# Patient Record
Sex: Female | Born: 2000 | Race: White | Hispanic: No | Marital: Single | Smoking: Never smoker
Health system: Southern US, Community
[De-identification: ages and names within clinical notes are randomized; demographics above are authoritative.]

## PROBLEM LIST (undated history)

## (undated) DIAGNOSIS — O1490 Unspecified pre-eclampsia, unspecified trimester: Secondary | ICD-10-CM

## (undated) DIAGNOSIS — F32A Depression, unspecified: Secondary | ICD-10-CM

## (undated) HISTORY — PX: NO PAST SURGERIES: SHX2092

---
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
## 2021-02-25 ENCOUNTER — Encounter (HOSPITAL_COMMUNITY): Payer: Self-pay

## 2021-02-25 ENCOUNTER — Emergency Department (HOSPITAL_COMMUNITY)
Admission: EM | Admit: 2021-02-25 | Discharge: 2021-02-26 | Disposition: A | Discharge status: Home / Self Care | Payer: BC Managed Care – PPO | Attending: Emergency Medicine | Admitting: Emergency Medicine

## 2021-02-25 ENCOUNTER — Other Ambulatory Visit: Payer: Self-pay

## 2021-02-25 DIAGNOSIS — T50904A Poisoning by unspecified drugs, medicaments and biological substances, undetermined, initial encounter: Secondary | ICD-10-CM | POA: Diagnosis not present

## 2021-02-25 DIAGNOSIS — T402X4A Poisoning by other opioids, undetermined, initial encounter: Secondary | ICD-10-CM | POA: Diagnosis not present

## 2021-02-25 DIAGNOSIS — R519 Headache, unspecified: Secondary | ICD-10-CM | POA: Diagnosis not present

## 2021-02-25 DIAGNOSIS — F32A Depression, unspecified: Secondary | ICD-10-CM | POA: Insufficient documentation

## 2021-02-25 DIAGNOSIS — Z20822 Contact with and (suspected) exposure to covid-19: Secondary | ICD-10-CM | POA: Insufficient documentation

## 2021-02-25 DIAGNOSIS — F419 Anxiety disorder, unspecified: Secondary | ICD-10-CM | POA: Diagnosis not present

## 2021-02-25 DIAGNOSIS — T391X4A Poisoning by 4-Aminophenol derivatives, undetermined, initial encounter: Secondary | ICD-10-CM | POA: Insufficient documentation

## 2021-02-25 LAB — CBC WITH DIFFERENTIAL/PLATELET
Abs Immature Granulocytes: 0.04 10*3/uL (ref 0.00–0.07)
Basophils Absolute: 0 10*3/uL (ref 0.0–0.1)
Basophils Relative: 0 %
Eosinophils Absolute: 0 10*3/uL (ref 0.0–0.5)
Eosinophils Relative: 0 %
HCT: 39.8 % (ref 36.0–46.0)
Hemoglobin: 13.2 g/dL (ref 12.0–15.0)
Immature Granulocytes: 0 %
Lymphocytes Relative: 21 %
Lymphs Abs: 1.9 10*3/uL (ref 0.7–4.0)
MCH: 28.9 pg (ref 26.0–34.0)
MCHC: 33.2 g/dL (ref 30.0–36.0)
MCV: 87.3 fL (ref 80.0–100.0)
Monocytes Absolute: 0.5 10*3/uL (ref 0.1–1.0)
Monocytes Relative: 6 %
Neutro Abs: 6.5 10*3/uL (ref 1.7–7.7)
Neutrophils Relative %: 73 %
Platelets: 308 10*3/uL (ref 150–400)
RBC: 4.56 MIL/uL (ref 3.87–5.11)
RDW: 12.7 % (ref 11.5–15.5)
WBC: 9 10*3/uL (ref 4.0–10.5)
nRBC: 0 % (ref 0.0–0.2)

## 2021-02-25 LAB — COMPREHENSIVE METABOLIC PANEL
ALT: 15 U/L (ref 0–44)
AST: 18 U/L (ref 15–41)
Albumin: 4.3 g/dL (ref 3.5–5.0)
Alkaline Phosphatase: 58 U/L (ref 38–126)
Anion gap: 12 (ref 5–15)
BUN: 10 mg/dL (ref 6–20)
CO2: 19 mmol/L — ABNORMAL LOW (ref 22–32)
Calcium: 9.7 mg/dL (ref 8.9–10.3)
Chloride: 109 mmol/L (ref 98–111)
Creatinine, Ser: 0.76 mg/dL (ref 0.44–1.00)
GFR, Estimated: >60 mL/min (ref >60)
Glucose, Bld: 99 mg/dL (ref 70–99)
Potassium: 3.7 mmol/L (ref 3.5–5.1)
Sodium: 140 mmol/L (ref 135–145)
Total Bilirubin: 0.7 mg/dL (ref 0.3–1.2)
Total Protein: 7.6 g/dL (ref 6.5–8.1)

## 2021-02-25 LAB — I-STAT BETA HCG BLOOD, ED (MC, WL, AP ONLY): I-stat hCG, quantitative: <5 m[IU]/mL (ref <5)

## 2021-02-25 MED ORDER — SODIUM CHLORIDE 0.9 % IV BOLUS
1000.0000 mL | Freq: Once | INTRAVENOUS | Status: AC
  Administered 2021-02-26: 1000 mL via INTRAVENOUS

## 2021-02-25 NOTE — ED Provider Notes (Signed)
[facility]-EMERGENCY DEPT Provider Note   CSN: [id number] Arrival date & time: 02/25/21  2219     History Chief Complaint  Patient presents with  . Ingestion    Christine Brewer is a 20 y.o. female.  20 year old female presents to the emergency department for evaluation following intentional overdose.  She reports ingesting 5 to 6 tablets of Tylenol with codeine (300/30mg ).  Ingestion was at 2030 tonight.  States that she took this medication because she wanted "things to get quiet". She does endorse suicidal ideations with intent tonight, but quickly regretted her ingestion and attempted to self induce vomiting PTA. She c/o headache currently. No abdominal pain, vomiting. Denies other co-ingestion as well as prior hx of suicide attempt. Has been off her Zoloft, which she takes for anxiety, due to issues sleeping on the medication.       History reviewed. No pertinent past medical history.  There are no problems to display for this patient.   History reviewed. No pertinent surgical history.   OB History   No obstetric history on file.     No family history on file.  Social History   Tobacco Use  . Smoking status: Never Smoker  . Smokeless tobacco: Never Used  Substance Use Topics  . Alcohol use: Never  . Drug use: Never    Home Medications Prior to Admission medications   Medication Sig Start Date End Date Taking? Authorizing Provider  cetirizine (ZYRTEC) 10 MG tablet Take 10 mg by mouth daily.   Yes [provider]    Allergies    Azithromycin, Penicillins, and Cefdinir  Review of Systems   Review of Systems  Ten systems reviewed and are negative for acute change, except as noted in the HPI.    Physical Exam Updated Vital Signs BP 104/61   Pulse 84   Temp 99 F (37.2 C) (Oral)   Resp 16   Ht 5\' 4"  (1.626 m)   Wt 69.4 kg   SpO2 100%   BMI 26.26 kg/m   Physical Exam Vitals and nursing note reviewed.   Constitutional:      General: She is not in acute distress.    Appearance: She is well-developed and well-nourished. She is not diaphoretic.     Comments: Nontoxic appearing and in NAD  HENT:     Head: Normocephalic and atraumatic.  Eyes:     General: No scleral icterus.    Extraocular Movements: EOM normal.     Conjunctiva/sclera: Conjunctivae normal.     Comments: 55mm, equal reactive pupils.  Cardiovascular:     Rate and Rhythm: Regular rhythm. Tachycardia present.     Pulses: Normal pulses.  Pulmonary:     Effort: Pulmonary effort is normal. No respiratory distress.     Comments: Respirations even and unlabored. Lungs CTAB. Musculoskeletal:        General: Normal range of motion.     Cervical back: Normal range of motion.  Skin:    General: Skin is warm and dry.     Coloration: Skin is not pale.     Findings: No erythema or rash.  Neurological:     Mental Status: She is alert and oriented to person, place, and time.     Coordination: Coordination normal.  Psychiatric:        Mood and Affect: Mood is anxious and depressed. Affect is tearful.        Speech: Speech normal.        Behavior: Behavior  is withdrawn.        Thought Content: Thought content includes suicidal ideation. Thought content does not include homicidal ideation.     ED Results / Procedures / Treatments   Labs (all labs ordered are listed, but only abnormal results are displayed) Labs Reviewed  SALICYLATE LEVEL - Abnormal; Notable for the following components:      Result Value   Salicylate Lvl <7.0 (*)    All other components within normal limits  COMPREHENSIVE METABOLIC PANEL - Abnormal; Notable for the following components:   CO2 19 (*)    All other components within normal limits  RAPID URINE DRUG SCREEN, HOSP PERFORMED - Abnormal; Notable for the following components:   Opiates POSITIVE (*)    Tetrahydrocannabinol POSITIVE (*)    All other components within normal limits  ACETAMINOPHEN LEVEL  - Abnormal; Notable for the following components:   Acetaminophen (Tylenol), Serum <10 (*)    All other components within normal limits  RESP PANEL BY RT-PCR (FLU A&B, COVID) ARPGX2  ACETAMINOPHEN LEVEL  CBC WITH DIFFERENTIAL/PLATELET  ETHANOL  I-STAT BETA HCG BLOOD, ED ([facility], [facility], [facility] ONLY)    EKG EKG Interpretation  Date/Time:  Monday February 25 2021 23:19:03 EST Ventricular Rate:  93 PR Interval:    QRS Duration: 79 QT Interval:  338 QTC Calculation: 421 R Axis:   76 Text Interpretation: Sinus rhythm Confirmed by Kristine Royal [phone]) on 02/25/2021 11:30:11 PM   Radiology No results found.  Procedures Procedures   Medications Ordered in ED Medications  sodium chloride 0.9 % bolus 1,000 mL (1,000 mLs Intravenous New Bag/Given 02/26/21 0000)    ED Course  I have reviewed the triage vital signs and the nursing notes.  Pertinent labs & imaging results that were available during my care of the patient were reviewed by me and considered in my medical decision making (see chart for details).  Clinical Course as of 02/26/21 1751  Mon Feb 25, 2021  2255 Poison Control recommendations are for EKG, 4 hour tylenol level which would be at 0230, and 6 hr observation. [KH]  Tue Feb 26, 2021  0409 Repeat acetaminophen <10. Patient cleared after 6 hour observation.  [KH]    Clinical Course User Index [KH] Antony Madura, PA-C   MDM Rules/Calculators/A&P                          Patient presenting after overdose; vague SI, but not a clear motivator for ingestion today. Observed in the ED. Labs reassuring. Medically cleared and pending TTS assessment, recommendations. Disposition to be determined by oncoming ED provider.   Final Clinical Impression(s) / ED Diagnoses Final diagnoses:  Drug overdose, undetermined intent, initial encounter    Rx / DC Orders ED Discharge Orders    None       Antony Madura, PA-C 02/26/21 0615    Wynetta Fines, MD 02/26/21 1455

## 2021-02-25 NOTE — ED Triage Notes (Addendum)
Pt sts ingesting 5-6 Acetaminophen/ Codeine 300-30mg  at 2030 tonight. Pt denies suicidal ideation or self harm. Sts she was just trying to get some "good sleep".

## 2021-02-25 NOTE — ED Notes (Signed)
Poison control suggests...  EKG 4 hour tylenol level which would be 0230am 6 hr observation

## 2021-02-26 ENCOUNTER — Encounter (HOSPITAL_COMMUNITY): Payer: Self-pay | Admitting: Registered Nurse

## 2021-02-26 DIAGNOSIS — T50901A Poisoning by unspecified drugs, medicaments and biological substances, accidental (unintentional), initial encounter: Secondary | ICD-10-CM | POA: Insufficient documentation

## 2021-02-26 DIAGNOSIS — T50904A Poisoning by unspecified drugs, medicaments and biological substances, undetermined, initial encounter: Secondary | ICD-10-CM

## 2021-02-26 LAB — ACETAMINOPHEN LEVEL
Acetaminophen (Tylenol), Serum: 13 ug/mL (ref 10–30)
Acetaminophen (Tylenol), Serum: <10 ug/mL — ABNORMAL LOW (ref 10–30)

## 2021-02-26 LAB — RESP PANEL BY RT-PCR (FLU A&B, COVID) ARPGX2
Influenza A by PCR: NEGATIVE
Influenza B by PCR: NEGATIVE
SARS Coronavirus 2 by RT PCR: NEGATIVE

## 2021-02-26 LAB — SALICYLATE LEVEL: Salicylate Lvl: <7 mg/dL — ABNORMAL LOW (ref 7.0–30.0)

## 2021-02-26 LAB — RAPID URINE DRUG SCREEN, HOSP PERFORMED
Amphetamines: NOT DETECTED
Barbiturates: NOT DETECTED
Benzodiazepines: NOT DETECTED
Cocaine: NOT DETECTED
Opiates: POSITIVE — AB
Tetrahydrocannabinol: POSITIVE — AB

## 2021-02-26 LAB — ETHANOL: Alcohol, Ethyl (B): <10 mg/dL (ref <10)

## 2021-02-26 MED ORDER — FLUOXETINE HCL 10 MG PO CAPS: 10.0000 mg | ORAL_CAPSULE | Freq: Every day | ORAL | 1 refills | Status: AC

## 2021-02-26 MED ORDER — FLUOXETINE HCL 10 MG PO CAPS
10.0000 mg | ORAL_CAPSULE | Freq: Every day | ORAL | Status: DC
  Administered 2021-02-26: 10 mg via ORAL
  Filled 2021-02-26: qty 1

## 2021-02-26 NOTE — ED Notes (Signed)
Pt mother is at bedside. Home medication has been returned to her.

## 2021-02-26 NOTE — [facility] Assessment (Signed)
[facility] Assessment Progress Note  Per Shuvon Rankin, NP, this voluntary pt does not require psychiatric hospitalization at this time.  Pt is psychiatrically cleared.  Shuvon reports that pt will need outpatient psychiatry and therapy.  This Clinical research associate spoke to pt, discussing Partial Hospitalization Program with her.  She is not interested in this.  She already sees an outpatient therapist, but agrees to have in intake appointment for psychiatry scheduled for her.  At 10:48 I called the [facility] [place] and spoke to Aragon.  She has scheduled pt for an intake appointment with Thresa Ross, MD on Wednesday 04/03/2021 at 11:00.  This has been included in pt's discharge instrucitions.  EDP Derwood Kaplan, MD and pt's nurse, Cammy Copa, have been notified.  Doylene Canning, MA Triage Specialist [phone]

## 2021-02-26 NOTE — ED Notes (Signed)
Gina (poison control) will close out poison control case for pt.  RN provided lab value of most recent acetaminophen, ECG results and VS.

## 2021-02-26 NOTE — Consult Note (Signed)
[facility] Face-to-Face Psychiatry Consult   Reason for Consult:  Overdose Referring Physician:  [facility] EDP Patient Identification: Christine Brewer MRN:  [id number] Principal Diagnosis: <principal problem not specified> Diagnosis:  Active Problems:   * No active hospital problems. *   Total Time spent with patient: 30 minutes  Subjective:   Christine Brewer is a 20 y.o. female patient admitted to [facility] ED after presenting with complaints of overdosing on medication.  HPI:  Christine Brewer, 20 y.o., female patient seen face to face  by this provider, consulted with Dr. Lucianne Muss; and chart reviewed on 02/26/21.  On evaluation Christine Brewer reports she was trying to sleep and took to much of her medications.  Patient states at no time was she suicidal.  Patient lives with her mother and currently has outpatient psychiatric services.  Has tried medications in past but none has worked.  Denies prior suicide attempt.  Mother is at patient bedside and feels that patient is safe to come home; feels that it was an accident but dose have stressors and would benefit from medications to help with anxiety and depression.   During evaluation Christine Brewer is sitting up in bed in no acute distress.  She is alert, oriented x 4, calm and cooperative.  Her mood is depressed, anxious with congruent affect.  She does not appear to be responding to internal/external stimuli or delusional thoughts.  Patient denies suicidal/self-harm/homicidal ideation, psychosis, and paranoia.  Patient answered question appropriately.  Safety Plan: Patient lives with parents will go to mother, call 911, mobile crisis if start to feel suicidal Patient will follow up with outpatient psychiatric services The suicide prevention education provided includes the following:  Suicide risk factors  Suicide prevention and interventions  National Suicide Hotline telephone number  [facility] assessment telephone  number  [facility] Emergency Assistance 911  [facility] and/or Residential Mobile Crisis Unit telephone number   Request made of family/significant other to:  Remove weapons (e.g., guns, rifles, knives), all items previously/currently identified as safety concern.   Remove drugs/medications (over the counter, prescriptions, illicit drugs), all items previously/currently identified as a safety concern.   Past Psychiatric History: Anxiety  Risk to Self:  No Risk to Others:  No Prior Inpatient Therapy:  No Prior Outpatient Therapy:  Yes  Past Medical History: History reviewed. No pertinent past medical history. History reviewed. No pertinent surgical history. Family History: History reviewed. No pertinent family history. Family Psychiatric  History: Anxiety Social History:  Social History   Substance and Sexual Activity  Alcohol Use Never     Social History   Substance and Sexual Activity  Drug Use Never    Social History   Socioeconomic History  . Marital status: Single    Spouse name: Not on file  . Number of children: Not on file  . Years of education: Not on file  . Highest education level: Not on file  Occupational History  . Not on file  Tobacco Use  . Smoking status: Never Smoker  . Smokeless tobacco: Never Used  Substance and Sexual Activity  . Alcohol use: Never  . Drug use: Never  . Sexual activity: Not on file  Other Topics Concern  . Not on file  Social History Narrative  . Not on file   Social Determinants of Health   Financial Resource Strain: Not on file  Food Insecurity: Not on file  Transportation Needs: Not on file  Physical Activity: Not on file  Stress: Not on file  Social Connections: Not on file   Additional Social History:    Allergies:   Allergies  Allergen Reactions  . Azithromycin Nausea Only, Rash and Other (See Comments)  . Penicillins Nausea Only, Rash and Other (See Comments)  . Cefdinir Other (See Comments)     Labs:  Results for orders placed or performed during the hospital encounter of 02/25/21 (from the past 48 hour(s))  Rapid urine drug screen (hospital performed)     Status: Abnormal   Collection Time: 02/25/21 10:57 PM  Result Value Ref Range   Opiates POSITIVE (A) NONE DETECTED   Cocaine NONE DETECTED NONE DETECTED   Benzodiazepines NONE DETECTED NONE DETECTED   Amphetamines NONE DETECTED NONE DETECTED   Tetrahydrocannabinol POSITIVE (A) NONE DETECTED   Barbiturates NONE DETECTED NONE DETECTED    Comment: (NOTE) DRUG SCREEN FOR MEDICAL PURPOSES ONLY.  IF CONFIRMATION IS NEEDED FOR ANY PURPOSE, NOTIFY LAB WITHIN 5 DAYS.  LOWEST DETECTABLE LIMITS FOR URINE DRUG SCREEN Drug Class                     Cutoff (ng/mL) Amphetamine and metabolites    1000 Barbiturate and metabolites    200 Benzodiazepine                 200 Tricyclics and metabolites     300 Opiates and metabolites        300 Cocaine and metabolites        300 THC                            50 Performed at [facility], [address] [place], [place] [address]   Acetaminophen level     Status: None   Collection Time: 02/25/21 11:23 PM  Result Value Ref Range   Acetaminophen (Tylenol), Serum 13 10 - 30 ug/mL    Comment: (NOTE) Therapeutic concentrations vary significantly. A range of 10-30 ug/mL  may be an effective concentration for many patients. However, some  are best treated at concentrations outside of this range. Acetaminophen concentrations >150 ug/mL at 4 hours after ingestion  and >50 ug/mL at 12 hours after ingestion are often associated with  toxic reactions.  Performed at [facility], [address] [place], [place] [address]   Salicylate level     Status: Abnormal   Collection Time: 02/25/21 11:23 PM  Result Value Ref Range   Salicylate Lvl <7.0 (L) 7.0 - 30.0 mg/dL    Comment: Performed at [facility], [address]  [place], [place] [address]  CBC with Differential     Status: None   Collection Time: 02/25/21 11:23 PM  Result Value Ref Range   WBC 9.0 4.0 - 10.5 K/uL   RBC 4.56 3.87 - 5.11 MIL/uL   Hemoglobin 13.2 12.0 - 15.0 g/dL   HCT 91.439.8 78.236.0 - 95.646.0 %   MCV 87.3 80.0 - 100.0 fL   MCH 28.9 26.0 - 34.0 pg   MCHC 33.2 30.0 - 36.0 g/dL   RDW 21.312.7 08.611.5 - 57.815.5 %   Platelets 308 150 - 400 K/uL   nRBC 0.0 0.0 - 0.2 %   Neutrophils Relative % 73 %   Neutro Abs 6.5 1.7 - 7.7 K/uL   Lymphocytes Relative 21 %   Lymphs Abs 1.9 0.7 - 4.0 K/uL   Monocytes Relative 6 %   Monocytes Absolute 0.5 0.1 - 1.0 K/uL  Eosinophils Relative 0 %   Eosinophils Absolute 0.0 0.0 - 0.5 K/uL   Basophils Relative 0 %   Basophils Absolute 0.0 0.0 - 0.1 K/uL   Immature Granulocytes 0 %   Abs Immature Granulocytes 0.04 0.00 - 0.07 K/uL    Comment: Performed at [facility], [address], [place], [place] [address]  Comprehensive metabolic panel     Status: Abnormal   Collection Time: 02/25/21 11:23 PM  Result Value Ref Range   Sodium 140 135 - 145 mmol/L   Potassium 3.7 3.5 - 5.1 mmol/L   Chloride 109 98 - 111 mmol/L   CO2 19 (L) 22 - 32 mmol/L   Glucose, Bld 99 70 - 99 mg/dL    Comment: Glucose reference range applies only to samples taken after fasting for at least 8 hours.   BUN 10 6 - 20 mg/dL   Creatinine, Ser 4.78 0.44 - 1.00 mg/dL   Calcium 9.7 8.9 - 29.5 mg/dL   Total Protein 7.6 6.5 - 8.1 g/dL   Albumin 4.3 3.5 - 5.0 g/dL   AST 18 15 - 41 U/L   ALT 15 0 - 44 U/L   Alkaline Phosphatase 58 38 - 126 U/L   Total Bilirubin 0.7 0.3 - 1.2 mg/dL   GFR, Estimated >62 >13 mL/min    Comment: (NOTE) Calculated using the CKD-EPI Creatinine Equation (2021)    Anion gap 12 5 - 15    Comment: Performed at [facility], [address] [place], [place] [address]  Ethanol     Status: None   Collection Time: 02/25/21 11:23 PM  Result Value Ref Range   Alcohol, Ethyl (B) <10 <10  mg/dL    Comment: (NOTE) Lowest detectable limit for serum alcohol is 10 mg/dL.  For medical purposes only. Performed at [facility], [address], [place], [place] [address]   I-Stat Beta hCG blood, ED ([facility], [facility], [facility] only)     Status: None   Collection Time: 02/25/21 11:32 PM  Result Value Ref Range   I-stat hCG, quantitative <5.0 <5 mIU/mL   Comment 3            Comment:   GEST. AGE      CONC.  (mIU/mL)   <=1 WEEK        5 - 50     2 WEEKS       50 - 500     3 WEEKS       100 - 10,000     4 WEEKS     1,000 - 30,000        FEMALE AND NON-PREGNANT FEMALE:     LESS THAN 5 mIU/mL   Resp Panel by RT-PCR (Flu A&B, Covid) Nasopharyngeal Swab     Status: None   Collection Time: 02/26/21  1:27 AM   Specimen: Nasopharyngeal Swab; Nasopharyngeal(NP) swabs in vial transport medium  Result Value Ref Range   SARS Coronavirus 2 by RT PCR NEGATIVE NEGATIVE    Comment: (NOTE) SARS-CoV-2 target nucleic acids are NOT DETECTED.  The SARS-CoV-2 RNA is generally detectable in upper respiratory specimens during the acute phase of infection. The lowest concentration of SARS-CoV-2 viral copies this assay can detect is 138 copies/mL. A negative result does not preclude SARS-Cov-2 infection and should not be used as the sole basis for treatment or other patient management decisions. A negative result may occur with  improper specimen collection/handling, submission of specimen other than  nasopharyngeal swab, presence of viral mutation(s) within the areas targeted by this assay, and inadequate number of viral copies(<138 copies/mL). A negative result must be combined with clinical observations, patient history, and epidemiological information. The expected result is Negative.  Fact Sheet for Patients:  BloggerCourse.com  Fact Sheet for Healthcare Providers:  SeriousBroker.it  This test is no t yet approved or cleared by the  [place] FDA and  has been authorized for detection and/or diagnosis of SARS-CoV-2 by FDA under an Emergency Use Authorization (EUA). This EUA will remain  in effect (meaning this test can be used) for the duration of the COVID-19 declaration under Section 564(b)(1) of the Act, 21 U.S.C.section 360bbb-3(b)(1), unless the authorization is terminated  or revoked sooner.       Influenza A by PCR NEGATIVE NEGATIVE   Influenza B by PCR NEGATIVE NEGATIVE    Comment: (NOTE) The Xpert Xpress SARS-CoV-2/FLU/RSV plus assay is intended as an aid in the diagnosis of influenza from Nasopharyngeal swab specimens and should not be used as a sole basis for treatment. Nasal washings and aspirates are unacceptable for Xpert Xpress SARS-CoV-2/FLU/RSV testing.  Fact Sheet for Patients: BloggerCourse.com  Fact Sheet for Healthcare Providers: SeriousBroker.it  This test is not yet approved or cleared by the [place] FDA and has been authorized for detection and/or diagnosis of SARS-CoV-2 by FDA under an Emergency Use Authorization (EUA). This EUA will remain in effect (meaning this test can be used) for the duration of the COVID-19 declaration under Section 564(b)(1) of the Act, 21 U.S.C. section 360bbb-3(b)(1), unless the authorization is terminated or revoked.  Performed at [facility], [address], [place], [place] [address]   Acetaminophen level     Status: Abnormal   Collection Time: 02/26/21  3:11 AM  Result Value Ref Range   Acetaminophen (Tylenol), Serum <10 (L) 10 - 30 ug/mL    Comment: (NOTE) Therapeutic concentrations vary significantly. A range of 10-30 ug/mL  may be an effective concentration for many patients. However, some  are best treated at concentrations outside of this range. Acetaminophen concentrations >150 ug/mL at 4 hours after ingestion  and >50 ug/mL at 12 hours after ingestion are often  associated with  toxic reactions.  Performed at [facility], [address] [place], [place] [address]     Current Facility-Administered Medications  Medication Dose Route Frequency Provider Last Rate Last Admin  . FLUoxetine (PROZAC) capsule 10 mg  10 mg Oral Daily Brewer, Christine B, NP   10 mg at 02/26/21 1118   Current Outpatient Medications  Medication Sig Dispense Refill  . cetirizine (ZYRTEC) 10 MG tablet Take 10 mg by mouth daily.    [name] FLUoxetine (PROZAC) 10 MG capsule Take 1 capsule (10 mg total) by mouth daily. 30 capsule 1    Musculoskeletal: Strength & Muscle Tone: within normal limits Gait & Station: normal Patient leans: N/A  Psychiatric Specialty Exam: Physical Exam Vitals and nursing note reviewed. Chaperone present: Sitter at bedside.  Constitutional:      General: She is not in acute distress.    Appearance: Normal appearance. She is not ill-appearing.  HENT:     Head: Normocephalic.  Eyes:     Pupils: Pupils are equal, round, and reactive to light.  Cardiovascular:     Rate and Rhythm: Normal rate.  Pulmonary:     Effort: Pulmonary effort is normal.  Musculoskeletal:        General: Normal range of motion.  Cervical back: Normal range of motion.  Skin:    General: Skin is warm and dry.  Neurological:     Mental Status: She is alert and oriented to person, place, and time.  Psychiatric:        Attention and Perception: Attention and perception normal. She does not perceive auditory or visual hallucinations.        Mood and Affect: Affect normal. Mood is anxious.        Behavior: Behavior normal. Behavior is cooperative.        Thought Content: Thought content normal. Thought content is not paranoid or delusional. Thought content does not include homicidal or suicidal ideation.        Cognition and Memory: Cognition and memory normal.        Judgment: Judgment normal.     Review of Systems  Constitutional: Negative.   HENT:  Negative.   Eyes: Negative.   Respiratory: Negative.   Cardiovascular: Negative.   Gastrointestinal: Negative.   Genitourinary: Negative.   Musculoskeletal: Negative.   Skin: Negative.   Neurological: Negative.   Hematological: Negative.   Psychiatric/Behavioral: Negative for agitation, confusion, hallucinations, self-injury and suicidal ideas. Sleep disturbance: Reporting episodes of increased anxiety which causes sleeping issues. The patient is nervous/anxious.        Patient reporting she did not try to kill herself.  States increased anxiety and unable to calm down or go to sleep and just took to much medications without thinking but at no time was she trying to kill herself. "Once I realized I had took to much medicine I called my girlfriend, who called my boyfriend.  My boyfriend called my mom and my mom called my sister who is a Engineer, civil (consulting) and told me to come to the hospital just to be checked out to make sure nothing was wrong."     Blood pressure 119/71, pulse (!) 56, temperature 99 F (37.2 C), temperature source Oral, resp. rate 16, height  (1.626 m), weight 69.4 kg, SpO2 99 %.Body mass index is 26.26 kg/m.  General Appearance: Casual  Eye Contact:  Good  Speech:  Clear and Coherent and Normal Rate  Volume:  Normal  Mood:  Anxious and Depressed  Affect:  Appropriate and Congruent  Thought Process:  Coherent, Goal Directed and Descriptions of Associations: Intact  Orientation:  Full (Time, Place, and Person)  Thought Content:  WDL  Suicidal Thoughts:  No  Homicidal Thoughts:  No  Memory:  Immediate;   Good Recent;   Good  Judgement:  Intact  Insight:  Present  Psychomotor Activity:  Normal  Concentration:  Concentration: Good and Attention Span: Good  Recall:  Good  Fund of Knowledge:  Good  Language:  Good  Akathisia:  No  Handed:  Right  AIMS (if indicated):     Assets:  Communication Skills Desire for Improvement Housing Physical Health Resilience Social  Support  ADL's:  Intact  Cognition:  WNL  Sleep:       Treatment Plan Summary: Plan Psychiatrically clear, Refer to outpatient psychiatric services for medicaton management and therapy  Safety Plan:  Patient will reach out to her mother, boyfriend and other family if she becomes suicidal Patient will follow up with outpatient psychiatric services that have been set up The suicide prevention education provided includes the following:  Suicide risk factors  Suicide prevention and interventions  National Suicide Hotline telephone number  [facility] assessment telephone number  [facility] Emergency Assistance 911  County and/or Residential Mobile Crisis Unit telephone number   Request made of family/significant other to:  Remove weapons (e.g., guns, rifles, knives), all items previously/currently identified as safety concern.   Remove drugs/medications (over the counter, prescriptions, illicit drugs), all items previously/currently identified as a safety concern.   Disposition:  Psychiatrically Clear No evidence of imminent risk to self or others at present.   Patient does not meet criteria for psychiatric inpatient admission. Supportive therapy provided about ongoing stressors. Discussed crisis plan, support from social network, calling 911, coming to the Emergency Department, and calling Suicide Hotline.  Christine Rankin, NP 02/26/2021 11:45 AM

## 2021-02-26 NOTE — Discharge Instructions (Signed)
For your behavioral health needs you are advised to follow up with outpatient psychiatry and therapy.  Contact your current therapist as soon as possible to schedule an appointment.  For psychiatry you are scheduled for an intake appointment with Thresa Ross, MD at the [facility] at [place]. Your appointment is Wednesday, April 03, 2021 at 11:00 am.  Due to Covid-19 this appointment is virtual:       [facility] [address]      [place], [place] [address]      [phone]

## 2021-04-03 ENCOUNTER — Telehealth (HOSPITAL_COMMUNITY): Payer: Self-pay | Admitting: Psychiatry

## 2021-04-03 ENCOUNTER — Telehealth (HOSPITAL_COMMUNITY): Payer: BC Managed Care – PPO | Admitting: Psychiatry

## 2021-04-10 ENCOUNTER — Emergency Department (HOSPITAL_COMMUNITY): Payer: BC Managed Care – PPO

## 2021-04-10 ENCOUNTER — Other Ambulatory Visit: Payer: Self-pay

## 2021-04-10 ENCOUNTER — Emergency Department (HOSPITAL_COMMUNITY)
Admission: EM | Admit: 2021-04-10 | Discharge: 2021-04-10 | Disposition: A | Discharge status: Home / Self Care | Payer: BC Managed Care – PPO | Attending: Emergency Medicine | Admitting: Emergency Medicine

## 2021-04-10 ENCOUNTER — Encounter (HOSPITAL_COMMUNITY): Payer: Self-pay | Admitting: Emergency Medicine

## 2021-04-10 DIAGNOSIS — R102 Pelvic and perineal pain: Secondary | ICD-10-CM

## 2021-04-10 DIAGNOSIS — A499 Bacterial infection, unspecified: Secondary | ICD-10-CM | POA: Diagnosis not present

## 2021-04-10 DIAGNOSIS — N760 Acute vaginitis: Secondary | ICD-10-CM | POA: Insufficient documentation

## 2021-04-10 DIAGNOSIS — B3731 Acute candidiasis of vulva and vagina: Secondary | ICD-10-CM

## 2021-04-10 DIAGNOSIS — R42 Dizziness and giddiness: Secondary | ICD-10-CM | POA: Insufficient documentation

## 2021-04-10 DIAGNOSIS — B373 Candidiasis of vulva and vagina: Secondary | ICD-10-CM | POA: Insufficient documentation

## 2021-04-10 DIAGNOSIS — R112 Nausea with vomiting, unspecified: Secondary | ICD-10-CM | POA: Diagnosis not present

## 2021-04-10 DIAGNOSIS — R1031 Right lower quadrant pain: Secondary | ICD-10-CM | POA: Diagnosis present

## 2021-04-10 DIAGNOSIS — B9689 Other specified bacterial agents as the cause of diseases classified elsewhere: Secondary | ICD-10-CM

## 2021-04-10 LAB — CBC WITH DIFFERENTIAL/PLATELET
Abs Immature Granulocytes: 0.03 10*3/uL (ref 0.00–0.07)
Basophils Absolute: 0 10*3/uL (ref 0.0–0.1)
Basophils Relative: 0 %
Eosinophils Absolute: 0.1 10*3/uL (ref 0.0–0.5)
Eosinophils Relative: 1 %
HCT: 37.5 % (ref 36.0–46.0)
Hemoglobin: 12.6 g/dL (ref 12.0–15.0)
Immature Granulocytes: 0 %
Lymphocytes Relative: 18 %
Lymphs Abs: 1.8 10*3/uL (ref 0.7–4.0)
MCH: 29.4 pg (ref 26.0–34.0)
MCHC: 33.6 g/dL (ref 30.0–36.0)
MCV: 87.6 fL (ref 80.0–100.0)
Monocytes Absolute: 0.6 10*3/uL (ref 0.1–1.0)
Monocytes Relative: 6 %
Neutro Abs: 7.4 10*3/uL (ref 1.7–7.7)
Neutrophils Relative %: 75 %
Platelets: 284 10*3/uL (ref 150–400)
RBC: 4.28 MIL/uL (ref 3.87–5.11)
RDW: 13.4 % (ref 11.5–15.5)
WBC: 9.9 10*3/uL (ref 4.0–10.5)
nRBC: 0 % (ref 0.0–0.2)

## 2021-04-10 LAB — URINALYSIS, ROUTINE W REFLEX MICROSCOPIC
Bilirubin Urine: NEGATIVE
Glucose, UA: NEGATIVE mg/dL
Hgb urine dipstick: NEGATIVE
Ketones, ur: NEGATIVE mg/dL
Nitrite: NEGATIVE
Protein, ur: NEGATIVE mg/dL
Specific Gravity, Urine: 1.015 (ref 1.005–1.030)
pH: 8 (ref 5.0–8.0)

## 2021-04-10 LAB — COMPREHENSIVE METABOLIC PANEL
ALT: 14 U/L (ref 0–44)
AST: 19 U/L (ref 15–41)
Albumin: 4.3 g/dL (ref 3.5–5.0)
Alkaline Phosphatase: 55 U/L (ref 38–126)
Anion gap: 7 (ref 5–15)
BUN: 9 mg/dL (ref 6–20)
CO2: 25 mmol/L (ref 22–32)
Calcium: 9.4 mg/dL (ref 8.9–10.3)
Chloride: 111 mmol/L (ref 98–111)
Creatinine, Ser: 0.91 mg/dL (ref 0.44–1.00)
GFR, Estimated: >60 mL/min (ref >60)
Glucose, Bld: 103 mg/dL — ABNORMAL HIGH (ref 70–99)
Potassium: 3.6 mmol/L (ref 3.5–5.1)
Sodium: 143 mmol/L (ref 135–145)
Total Bilirubin: 0.4 mg/dL (ref 0.3–1.2)
Total Protein: 7 g/dL (ref 6.5–8.1)

## 2021-04-10 LAB — I-STAT BETA HCG BLOOD, ED (MC, WL, AP ONLY): I-stat hCG, quantitative: <5 m[IU]/mL (ref <5)

## 2021-04-10 LAB — WET PREP, GENITAL
Sperm: NONE SEEN
Trich, Wet Prep: NONE SEEN

## 2021-04-10 LAB — LIPASE, BLOOD: Lipase: 33 U/L (ref 11–51)

## 2021-04-10 MED ORDER — METRONIDAZOLE 500 MG PO TABS: 500.0000 mg | ORAL_TABLET | Freq: Two times a day (BID) | ORAL | 0 refills | Status: AC

## 2021-04-10 MED ORDER — DICYCLOMINE HCL 10 MG PO CAPS
10.0000 mg | ORAL_CAPSULE | Freq: Once | ORAL | Status: AC
  Administered 2021-04-10: 10 mg via ORAL
  Filled 2021-04-10: qty 1

## 2021-04-10 MED ORDER — ONDANSETRON HCL 4 MG PO TABS: 4.0000 mg | ORAL_TABLET | Freq: Four times a day (QID) | ORAL | 0 refills | Status: DC

## 2021-04-10 MED ORDER — FLUCONAZOLE 150 MG PO TABS: ORAL_TABLET | ORAL | 0 refills | Status: DC

## 2021-04-10 MED ORDER — KETOROLAC TROMETHAMINE 30 MG/ML IJ SOLN
30.0000 mg | Freq: Once | INTRAMUSCULAR | Status: AC
  Administered 2021-04-10: 30 mg via INTRAVENOUS
  Filled 2021-04-10: qty 1

## 2021-04-10 MED ORDER — IOHEXOL 300 MG/ML  SOLN
100.0000 mL | Freq: Once | INTRAMUSCULAR | Status: AC | PRN
  Administered 2021-04-10: 100 mL via INTRAVENOUS

## 2021-04-10 MED ORDER — SODIUM CHLORIDE 0.9 % IV BOLUS
1000.0000 mL | Freq: Once | INTRAVENOUS | Status: AC
  Administered 2021-04-10: 1000 mL via INTRAVENOUS

## 2021-04-10 MED ORDER — ONDANSETRON HCL 4 MG/2ML IJ SOLN
4.0000 mg | Freq: Once | INTRAMUSCULAR | Status: AC
  Administered 2021-04-10: 4 mg via INTRAVENOUS
  Filled 2021-04-10: qty 2

## 2021-04-10 NOTE — Discharge Instructions (Addendum)
Your wet prep showed a yeast infection and bacterial vaginosis.  I have given you some medications to take for this  You were given a prescription for zofran to help with your nausea. Please take as directed.   You have been tested for HIV, syphilis, chlamydia and gonorrhea.  These results will be available in approximately 3 days and you will be contacted by the hospital if the results are positive. Avoid sexual contact until you are aware of the results, and please inform all sexual partners if you test positive for any of these diseases.  A culture was sent of your urine today to determine if there is any bacterial growth. If the results of the culture are positive and you require an antibiotic or a change of your prescribed antibiotic you will be contacted by the hospital. If the results are negative you will not be contacted.  Please follow up with your primary care provider within 5-7 days for re-evaluation of your symptoms. If you do not have a primary care provider, information for a healthcare clinic has been provided for you to make arrangements for follow up care. Please return to the emergency department for any new or worsening symptoms.

## 2021-04-10 NOTE — ED Triage Notes (Signed)
Patient woke up around 1 am with emesis and severe RLQ abd pain, went to [facility] but they referred her to the ED. States she took a shower today, fell and passed out in the shower earlier, hit her head.

## 2021-04-10 NOTE — ED Notes (Signed)
Pt ambulatory on discharge.

## 2021-04-10 NOTE — ED Provider Notes (Signed)
[facility]-EMERGENCY DEPT Provider Note   CSN: [id number] Arrival date & time: 04/10/21  1412     History Chief Complaint  Patient presents with  . Abdominal Pain  . Emesis    Christine Brewer is a 20 y.o. female.  HPI   Pt is an 20 y/o female who presents to the ED today for eval of lower abd pain that started this morning. She reports associated nv as well. States she took a showed this am, got lightheaded and passed out. States she hit her head. She has vomited x2 since then. Denies vision changes, numbness, weakness. Denies injuries from the fall.  States she had a temp of 100.56F at home. She took tylenol PTA.  Reports increased urinary frequency. Denies dysuria, hematuria, fever, vaginal bleeding, vaginal discharge, or diarrhea. She has had some congestion and a sore throat for 3 weeks from allergies.   LMP was 3 weeks ago.   History reviewed. No pertinent past medical history.  Patient Active Problem List   Diagnosis Date Noted  . Drug overdose     History reviewed. No pertinent surgical history.   OB History   No obstetric history on file.     No family history on file.  Social History   Tobacco Use  . Smoking status: Never Smoker  . Smokeless tobacco: Never Used  Substance Use Topics  . Alcohol use: Never  . Drug use: Never    Home Medications Prior to Admission medications   Medication Sig Start Date End Date Taking? Authorizing Provider  fluconazole (DIFLUCAN) 150 MG tablet Take one tablet on the day you fill the prescription. If you continue to have symptoms then take the second tablet in 3 days. 04/10/21  Yes Bryce Kimble S, PA-C  metroNIDAZOLE (FLAGYL) 500 MG tablet Take 1 tablet (500 mg total) by mouth 2 (two) times daily for 7 days. 04/10/21 04/17/21 Yes Keirsten Matuska S, PA-C  ondansetron (ZOFRAN) 4 MG tablet Take 1 tablet (4 mg total) by mouth every 6 (six) hours. 04/10/21  Yes Siddhartha Hoback S, PA-C  cetirizine (ZYRTEC) 10  MG tablet Take 10 mg by mouth daily.    [provider]  FLUoxetine (PROZAC) 10 MG capsule Take 1 capsule (10 mg total) by mouth daily. 02/26/21   Rankin, Shuvon B, NP    Allergies    Azithromycin, Penicillins, and Cefdinir  Review of Systems   Review of Systems  Constitutional: Negative for chills and fever.  HENT: Negative for ear pain and sore throat.   Eyes: Negative for pain and visual disturbance.  Respiratory: Negative for cough and shortness of breath.   Cardiovascular: Negative for chest pain.  Gastrointestinal: Positive for abdominal pain, nausea and vomiting. Negative for constipation and diarrhea.  Genitourinary: Positive for frequency and pelvic pain. Negative for dysuria, hematuria, urgency, vaginal bleeding and vaginal discharge.  Musculoskeletal: Negative for back pain.  Skin: Negative for rash.  Neurological: Negative for headaches.  All other systems reviewed and are negative.   Physical Exam Updated Vital Signs BP 135/67   Pulse 68   Temp 97.8 F (36.6 C)   Resp 18   Ht 5\' 4"  (1.626 m)   Wt 65.8 kg   LMP 03/08/2021   SpO2 97%   BMI 24.89 kg/m   Physical Exam Vitals and nursing note reviewed.  Constitutional:      General: She is not in acute distress.    Appearance: She is well-developed.  HENT:  Head: Normocephalic and atraumatic.  Eyes:     Conjunctiva/sclera: Conjunctivae normal.  Cardiovascular:     Rate and Rhythm: Normal rate and regular rhythm.     Heart sounds: Normal heart sounds. No murmur heard.   Pulmonary:     Effort: Pulmonary effort is normal. No respiratory distress.     Breath sounds: Normal breath sounds. No wheezing, rhonchi or rales.  Abdominal:     General: Bowel sounds are normal.     Palpations: Abdomen is soft.     Tenderness: There is abdominal tenderness in the right lower quadrant, suprapubic area and left lower quadrant.  Genitourinary:    Comments: Exam performed by Karrie Meres,  exam  chaperoned Date: 04/10/2021 Pelvic exam: normal external genitalia without evidence of trauma. VULVA: normal appearing vulva with no masses, tenderness or lesion. VAGINA: normal appearing vagina with normal color and discharge, no lesions. CERVIX: normal appearing cervix without lesions, cervical motion tenderness absent, cervical os closed with out purulent discharge; vaginal discharge - white, Wet prep and DNA probe for chlamydia and GC obtained.   ADNEXA: normal adnexa in size, nontender and no masses UTERUS: uterus is normal size, shape, consistency and nontender.  Musculoskeletal:     Cervical back: Neck supple.  Skin:    General: Skin is warm and dry.  Neurological:     Mental Status: She is alert.     ED Results / Procedures / Treatments   Labs (all labs ordered are listed, but only abnormal results are displayed) Labs Reviewed  WET PREP, GENITAL - Abnormal; Notable for the following components:      Result Value   Yeast Wet Prep HPF POC PRESENT (*)    Clue Cells Wet Prep HPF POC PRESENT (*)    WBC, Wet Prep HPF POC MODERATE (*)    All other components within normal limits  COMPREHENSIVE METABOLIC PANEL - Abnormal; Notable for the following components:   Glucose, Bld 103 (*)    All other components within normal limits  URINALYSIS, ROUTINE W REFLEX MICROSCOPIC - Abnormal; Notable for the following components:   APPearance HAZY (*)    Leukocytes,Ua SMALL (*)    Bacteria, UA MANY (*)    All other components within normal limits  URINE CULTURE  CBC WITH DIFFERENTIAL/PLATELET  LIPASE, BLOOD  I-STAT BETA HCG BLOOD, ED ([facility], [facility], [facility] ONLY)  GC/CHLAMYDIA PROBE AMP ([place]) NOT AT [facility]    EKG None  Radiology CT Head Wo Contrast  Result Date: 04/10/2021 CLINICAL DATA:  Right lower quadrant pain. Fall in shower, hit head. EXAM: CT HEAD WITHOUT CONTRAST TECHNIQUE: Contiguous axial images were obtained from the base of the skull through the vertex without intravenous  contrast. COMPARISON:  None. FINDINGS: Brain: No acute intracranial abnormality. Specifically, no hemorrhage, hydrocephalus, mass lesion, acute infarction, or significant intracranial injury. Vascular: No hyperdense vessel or unexpected calcification. Skull: No acute calvarial abnormality. Sinuses/Orbits: Visualized paranasal sinuses and mastoids clear. Orbital soft tissues unremarkable. Other: None IMPRESSION: No acute intracranial abnormality. Electronically Signed   By: Charlett Nose M.D.   On: 04/10/2021 17:56   CT ABDOMEN PELVIS W CONTRAST  Result Date: 04/10/2021 CLINICAL DATA:  Right lower quadrant pain.  Fall in shower. EXAM: CT ABDOMEN AND PELVIS WITH CONTRAST TECHNIQUE: Multidetector CT imaging of the abdomen and pelvis was performed using the standard protocol following bolus administration of intravenous contrast. CONTRAST:  OMNIPAQUE IOHEXOL 300 MG/ML  SOLN COMPARISON:  None. FINDINGS: Lower chest: Lung bases are  clear. No effusions. Heart is normal size. Hepatobiliary: No focal hepatic abnormality. Gallbladder unremarkable. Focal fatty infiltration adjacent to the falciform ligament. Pancreas: No focal abnormality or ductal dilatation. Spleen: No focal abnormality.  Normal size. Adrenals/Urinary Tract: No adrenal abnormality. No focal renal abnormality. No stones or hydronephrosis. Urinary bladder is unremarkable. Stomach/Bowel: Stomach, large and small bowel grossly unremarkable. Appendix is normal. Vascular/Lymphatic: No evidence of aneurysm or adenopathy. Reproductive: Small follicle in the left ovary. Uterus and adnexa unremarkable. Other: Small amount of free fluid in the pelvis and tracking into the right paracolic gutter. No free air. Musculoskeletal: No acute bony abnormality. IMPRESSION: Appendix is normal. Free fluid in the pelvis and right paracolic gutter. This could be functional or related to ruptured ovarian cyst. Electronically Signed   By: Charlett NoseKevin  Dover M.D.   On: 04/10/2021  17:59    Procedures Procedures   Medications Ordered in ED Medications  ondansetron (ZOFRAN) injection 4 mg (4 mg Intravenous Given 04/10/21 1501)  sodium chloride 0.9 % bolus 1,000 mL (0 mLs Intravenous Stopped 04/10/21 1645)  dicyclomine (BENTYL) capsule 10 mg (10 mg Oral Given 04/10/21 1537)  ketorolac (TORADOL) 30 MG/ML injection 30 mg (30 mg Intravenous Given 04/10/21 1540)  iohexol (OMNIPAQUE) 300 MG/ML solution 100 mL (100 mLs Intravenous Contrast Given 04/10/21 1734)    ED Course  I have reviewed the triage vital signs and the nursing notes.  Pertinent labs & imaging results that were available during my care of the patient were reviewed by me and considered in my medical decision making (see chart for details).    MDM Rules/Calculators/A&P                          20 year old female presenting for evaluation of lower abdominal pain nausea and vomiting.  Also had a syncopal episode prior to arrival.  CBC, CMP and lipase are unremarkable.  Beta-hCG is negative.  UA shows some leuks, WBCs, bacteria, but this also appears contaminated.  We will send culture and await results prior to starting antibiotics.  Wet prep shows yeast, BV so we will treat her for that.  GC/chlamydia pending at this time.  CT head obtained and does not show any acute intracranial abnormality.  CT abdomen/pelvis -  Appendix is normal. Free fluid in the pelvis and right paracolic gutter. This could be functional or related to ruptured ovarian cyst.   On reassessment of the patient I recommended that we obtain a pelvic ultrasound to rule out further abnormality including ovarian torsion, ruptured cyst etc.  She does not want a proceed with ultrasound and states she just wants to be discharged home.  I discussed the risks of leaving prior to complete work-up and she voices understanding of this but still wants to be discharged.  I advised that she follow-up with her PCP and she was given symptomatic management for  her vomiting.  Have advised that she return to the ED for new or worsening symptoms in the meantime.  She voices understanding of the plan and reasons to return but all questions answered.  Patient stable for discharge.  Final Clinical Impression(s) / ED Diagnoses Final diagnoses:  Nausea and vomiting, intractability of vomiting not specified, unspecified vomiting type  Pelvic pain  Bacterial vaginosis  Vaginal yeast infection    Rx / DC Orders ED Discharge Orders         Ordered    fluconazole (DIFLUCAN) 150 MG tablet  04/10/21 1812    metroNIDAZOLE (FLAGYL) 500 MG tablet  2 times daily        04/10/21 1812    ondansetron (ZOFRAN) 4 MG tablet  Every 6 hours        04/10/21 1812           Karrie Meres, PA-C 04/10/21 1821    Jacalyn Lefevre, MD 04/10/21 [phone]

## 2021-04-11 LAB — GC/CHLAMYDIA PROBE AMP (~~LOC~~) NOT AT ARMC
Chlamydia: NEGATIVE
Comment: NEGATIVE
Comment: NORMAL
Neisseria Gonorrhea: NEGATIVE

## 2021-04-13 LAB — URINE CULTURE: Culture: >=100000 — AB

## 2021-04-14 ENCOUNTER — Telehealth: Payer: Self-pay

## 2021-04-14 NOTE — Progress Notes (Signed)
ED Antimicrobial Stewardship Positive Culture Follow Up   Christine Brewer is an 20 y.o. female who presented to [facility] on 04/10/2021 with a chief complaint of  Chief Complaint  Patient presents with  . Abdominal Pain  . Emesis    Recent Results (from the past 720 hour(s))  Urine culture     Status: Abnormal   Collection Time: 04/10/21  2:43 PM   Specimen: Urine, Clean Catch  Result Value Ref Range Status   Specimen Description   Final    URINE, CLEAN CATCH Performed at [facility], [address] [place], [place] [address]    Special Requests   Final    NONE Performed at [facility], [address], [place], [place] [address]    Culture >=100,000 COLONIES/mL ESCHERICHIA COLI (A)  Final   Report Status 04/13/2021 FINAL  Final   Organism ID, Bacteria ESCHERICHIA COLI (A)  Final      Susceptibility   Escherichia coli - MIC*    AMPICILLIN >=32 RESISTANT Resistant     CEFAZOLIN <=4 SENSITIVE Sensitive     CEFEPIME <=0.12 SENSITIVE Sensitive     CEFTRIAXONE <=0.25 SENSITIVE Sensitive     CIPROFLOXACIN <=0.25 SENSITIVE Sensitive     GENTAMICIN <=1 SENSITIVE Sensitive     IMIPENEM <=0.25 SENSITIVE Sensitive     NITROFURANTOIN <=16 SENSITIVE Sensitive     TRIMETH/SULFA <=20 SENSITIVE Sensitive     AMPICILLIN/SULBACTAM 8 SENSITIVE Sensitive     PIP/TAZO <=4 SENSITIVE Sensitive     * >=100,000 COLONIES/mL ESCHERICHIA COLI  Wet prep, genital     Status: Abnormal   Collection Time: 04/10/21  4:45 PM   Specimen: Cervix  Result Value Ref Range Status   Yeast Wet Prep HPF POC PRESENT (A) NONE SEEN Final   Trich, Wet Prep NONE SEEN NONE SEEN Final   Clue Cells Wet Prep HPF POC PRESENT (A) NONE SEEN Final   WBC, Wet Prep HPF POC MODERATE (A) NONE SEEN Final   Sperm NONE SEEN  Final    Comment: Performed at [facility], [address], [place], [place] [address]    []  Treated with , organism resistant to prescribed  antimicrobial [x]  Patient discharged originally without antimicrobial agent and treatment is now indicated (treated for BV and yeast infection with fluconazole and Flagyl but not for UTI).  New antibiotic prescription: Macrobid 100 mg bid x 5 days, #10, no refills  ED Provider: D 04/14/2021, 9:17 AM Clinical Pharmacist [phone]

## 2021-04-14 NOTE — Telephone Encounter (Signed)
Post ED Visit - Positive Culture Follow-up: Unsuccessful Patient Follow-up  Culture assessed and recommendations reviewed by:  []  , Pharm.D. []  Enzo Bi, Pharm.D., BCPS AQ-ID []  , Pharm.D., BCPS []  Celedonio Miyamoto, Pharm.D., BCPS []  [place], Garvin Fila.D., BCPS, AAHIVP []  , Pharm.D., BCPS, AAHIVP []  Georgina Pillion, PharmD []  , PharmD, BCPS Scott Christy Pharm D Positive urine culture  [x]  Patient discharged without antimicrobial prescription and treatment is now indicated []  Organism is resistant to prescribed ED discharge antimicrobial []  Patient with positive blood cultures Needs Macrobid 100 mg BId x 5D Per [place] PA  Unable to contact patient after 3 attempts, letter will be sent to address on file  [address] 04/14/2021, 9:35 AM

## 2021-04-21 ENCOUNTER — Encounter (HOSPITAL_BASED_OUTPATIENT_CLINIC_OR_DEPARTMENT_OTHER): Payer: Self-pay | Admitting: Emergency Medicine

## 2021-04-21 ENCOUNTER — Emergency Department (HOSPITAL_BASED_OUTPATIENT_CLINIC_OR_DEPARTMENT_OTHER)
Admission: EM | Admit: 2021-04-21 | Discharge: 2021-04-21 | Payer: BC Managed Care – PPO | Attending: Emergency Medicine | Admitting: Emergency Medicine

## 2021-04-21 ENCOUNTER — Emergency Department (HOSPITAL_COMMUNITY): Payer: BC Managed Care – PPO

## 2021-04-21 ENCOUNTER — Other Ambulatory Visit: Payer: Self-pay

## 2021-04-21 DIAGNOSIS — N946 Dysmenorrhea, unspecified: Secondary | ICD-10-CM | POA: Insufficient documentation

## 2021-04-21 DIAGNOSIS — K589 Irritable bowel syndrome without diarrhea: Secondary | ICD-10-CM | POA: Insufficient documentation

## 2021-04-21 DIAGNOSIS — O26891 Other specified pregnancy related conditions, first trimester: Secondary | ICD-10-CM | POA: Diagnosis present

## 2021-04-21 DIAGNOSIS — Z3A01 Less than 8 weeks gestation of pregnancy: Secondary | ICD-10-CM

## 2021-04-21 DIAGNOSIS — O3680X0 Pregnancy with inconclusive fetal viability, not applicable or unspecified: Secondary | ICD-10-CM

## 2021-04-21 DIAGNOSIS — Z3A00 Weeks of gestation of pregnancy not specified: Secondary | ICD-10-CM | POA: Diagnosis not present

## 2021-04-21 DIAGNOSIS — R1031 Right lower quadrant pain: Secondary | ICD-10-CM | POA: Diagnosis not present

## 2021-04-21 DIAGNOSIS — Z3490 Encounter for supervision of normal pregnancy, unspecified, unspecified trimester: Secondary | ICD-10-CM

## 2021-04-21 DIAGNOSIS — R112 Nausea with vomiting, unspecified: Secondary | ICD-10-CM | POA: Diagnosis not present

## 2021-04-21 DIAGNOSIS — F419 Anxiety disorder, unspecified: Secondary | ICD-10-CM | POA: Insufficient documentation

## 2021-04-21 DIAGNOSIS — K9041 Non-celiac gluten sensitivity: Secondary | ICD-10-CM | POA: Insufficient documentation

## 2021-04-21 HISTORY — DX: Depression, unspecified: F32.A

## 2021-04-21 LAB — COMPREHENSIVE METABOLIC PANEL
ALT: 14 U/L (ref 0–44)
AST: 17 U/L (ref 15–41)
Albumin: 4.1 g/dL (ref 3.5–5.0)
Alkaline Phosphatase: 42 U/L (ref 38–126)
Anion gap: 8 (ref 5–15)
BUN: 9 mg/dL (ref 6–20)
CO2: 24 mmol/L (ref 22–32)
Calcium: 9 mg/dL (ref 8.9–10.3)
Chloride: 105 mmol/L (ref 98–111)
Creatinine, Ser: 0.65 mg/dL (ref 0.44–1.00)
GFR, Estimated: >60 mL/min (ref >60)
Glucose, Bld: 80 mg/dL (ref 70–99)
Potassium: 4.1 mmol/L (ref 3.5–5.1)
Sodium: 137 mmol/L (ref 135–145)
Total Bilirubin: 0.9 mg/dL (ref 0.3–1.2)
Total Protein: 6.3 g/dL — ABNORMAL LOW (ref 6.5–8.1)

## 2021-04-21 LAB — CBC
HCT: 37.7 % (ref 36.0–46.0)
Hemoglobin: 12.5 g/dL (ref 12.0–15.0)
MCH: 29.1 pg (ref 26.0–34.0)
MCHC: 33.2 g/dL (ref 30.0–36.0)
MCV: 87.7 fL (ref 80.0–100.0)
Platelets: 336 10*3/uL (ref 150–400)
RBC: 4.3 MIL/uL (ref 3.87–5.11)
RDW: 13.2 % (ref 11.5–15.5)
WBC: 8.3 10*3/uL (ref 4.0–10.5)
nRBC: 0 % (ref 0.0–0.2)

## 2021-04-21 LAB — URINALYSIS, ROUTINE W REFLEX MICROSCOPIC
Bilirubin Urine: NEGATIVE
Glucose, UA: NEGATIVE mg/dL
Hgb urine dipstick: NEGATIVE
Ketones, ur: >80 mg/dL — AB
Leukocytes,Ua: NEGATIVE
Nitrite: NEGATIVE
Specific Gravity, Urine: 1.024 (ref 1.005–1.030)
pH: 5.5 (ref 5.0–8.0)

## 2021-04-21 LAB — CBC WITH DIFFERENTIAL/PLATELET
Abs Immature Granulocytes: 0.01 10*3/uL (ref 0.00–0.07)
Basophils Absolute: 0 10*3/uL (ref 0.0–0.1)
Basophils Relative: 0 %
Eosinophils Absolute: 0.1 10*3/uL (ref 0.0–0.5)
Eosinophils Relative: 1 %
HCT: 36 % (ref 36.0–46.0)
Hemoglobin: 12.2 g/dL (ref 12.0–15.0)
Immature Granulocytes: 0 %
Lymphocytes Relative: 32 %
Lymphs Abs: 2.6 10*3/uL (ref 0.7–4.0)
MCH: 29 pg (ref 26.0–34.0)
MCHC: 33.9 g/dL (ref 30.0–36.0)
MCV: 85.7 fL (ref 80.0–100.0)
Monocytes Absolute: 0.5 10*3/uL (ref 0.1–1.0)
Monocytes Relative: 7 %
Neutro Abs: 4.8 10*3/uL (ref 1.7–7.7)
Neutrophils Relative %: 60 %
Platelets: 308 10*3/uL (ref 150–400)
RBC: 4.2 MIL/uL (ref 3.87–5.11)
RDW: 13.2 % (ref 11.5–15.5)
WBC: 8 10*3/uL (ref 4.0–10.5)
nRBC: 0 % (ref 0.0–0.2)

## 2021-04-21 LAB — ABO/RH: ABO/RH(D): A POS

## 2021-04-21 LAB — PREGNANCY, URINE: Preg Test, Ur: POSITIVE — AB

## 2021-04-21 LAB — HCG, QUANTITATIVE, PREGNANCY: hCG, Beta Chain, Quant, S: 397 m[IU]/mL — ABNORMAL HIGH (ref <5)

## 2021-04-21 MED ORDER — SODIUM CHLORIDE 0.9 % IV BOLUS
500.0000 mL | Freq: Once | INTRAVENOUS | Status: AC
  Administered 2021-04-21: 500 mL via INTRAVENOUS

## 2021-04-21 MED ORDER — ONDANSETRON HCL 4 MG/2ML IJ SOLN
4.0000 mg | Freq: Once | INTRAMUSCULAR | Status: AC
  Administered 2021-04-21: 4 mg via INTRAVENOUS
  Filled 2021-04-21: qty 2

## 2021-04-21 MED ORDER — MORPHINE SULFATE (PF) 4 MG/ML IV SOLN
4.0000 mg | Freq: Once | INTRAVENOUS | Status: AC
Start: 2021-04-21 — End: 2021-04-21
  Administered 2021-04-21: 4 mg via INTRAVENOUS
  Filled 2021-04-21: qty 1

## 2021-04-21 NOTE — MAU Note (Signed)
.   Christine Brewer is a 20 y.o. at [redacted]w[redacted]d here in MAU reporting: lower right abdominal pain that started about x3 weeks ago. Has been nauseated and threw up this morning. Denies VB or abnormal discharge. Just found out she was pregnant this morning. Rates her pain 4/10. Unsure about dating, had 3 days of bleeding on 03/20/21. Last full period was around 02/27/21.   Pain score:4 Vitals:   04/21/21 0930 04/21/21 1102  BP: (!) 133/96 120/73  Pulse: (!) 110 77  Resp: 20 16  Temp:  99 F (37.2 C)  SpO2: 100% 100%

## 2021-04-21 NOTE — ED Provider Notes (Addendum)
[facility] EMERGENCY DEPT Provider Note   CSN: [id number] Arrival date & time: 04/21/21  0759     History Chief Complaint  Patient presents with  . Abdominal Pain    Christine Brewer is a 20 y.o. female.  HPI   20 year old female with past medical history of ovarian cyst presents emergency department with right lower quadrant abdominal pain associated with nausea/vomiting.  Patient states that this discomfort started yesterday, its been constant however this morning became suddenly severe and caused vomiting. She has had the pain before.  She was evaluated at an urgent care, pregnancy test was negative, she was diagnosed these infection and BV, declined ultrasound at that time.  States that she felt improved after that visit.  Her last bowel movement was yesterday and was normal.  States that she had a low-grade fever yesterday at work.  Denies any genitourinary symptoms, vaginal bleeding/discharge. Denies being sexually active currently, denies currently being pregnant.  History reviewed. No pertinent past medical history.  Patient Active Problem List   Diagnosis Date Noted  . Drug overdose     History reviewed. No pertinent surgical history.   OB History   No obstetric history on file.     No family history on file.  Social History   Tobacco Use  . Smoking status: Never Smoker  . Smokeless tobacco: Never Used  Substance Use Topics  . Alcohol use: Never  . Drug use: Never    Home Medications Prior to Admission medications   Medication Sig Start Date End Date Taking? Authorizing Provider  FLUoxetine (PROZAC) 10 MG capsule Take 1 capsule (10 mg total) by mouth daily. 02/26/21  Yes Rankin, Shuvon B, NP  cetirizine (ZYRTEC) 10 MG tablet Take 10 mg by mouth daily.    [provider]  fluconazole (DIFLUCAN) 150 MG tablet Take one tablet on the day you fill the prescription. If you continue to have symptoms then take the second tablet in 3 days.  04/10/21   Couture, Cortni S, PA-C  ondansetron (ZOFRAN) 4 MG tablet Take 1 tablet (4 mg total) by mouth every 6 (six) hours. 04/10/21   Couture, Cortni S, PA-C    Allergies    Azithromycin, Penicillins, and Cefdinir  Review of Systems   Review of Systems  Constitutional: Positive for appetite change and fever. Negative for chills.  HENT: Negative for congestion.   Eyes: Negative for visual disturbance.  Respiratory: Negative for shortness of breath.   Cardiovascular: Negative for chest pain.  Gastrointestinal: Positive for abdominal pain, nausea and vomiting. Negative for diarrhea.  Genitourinary: Negative for dysuria.  Skin: Negative for rash.  Neurological: Negative for headaches.    Physical Exam Updated Vital Signs BP 122/78   Pulse 82   Temp 98.6 F (37 C) (Oral)   Resp 20   Ht 5\' 4"  (1.626 m)   Wt 65 kg   SpO2 100%   BMI 24.60 kg/m   Physical Exam Vitals and nursing note reviewed.  Constitutional:      Appearance: Normal appearance.  HENT:     Head: Normocephalic.     Mouth/Throat:     Mouth: Mucous membranes are moist.  Cardiovascular:     Rate and Rhythm: Normal rate.  Pulmonary:     Effort: Pulmonary effort is normal. No respiratory distress.  Abdominal:     Palpations: Abdomen is soft.     Tenderness: There is abdominal tenderness in the right lower quadrant. There is rebound. There is no guarding.  Skin:    General: Skin is warm.  Neurological:     Mental Status: She is alert and oriented to person, place, and time. Mental status is at baseline.  Psychiatric:        Mood and Affect: Mood normal.     ED Results / Procedures / Treatments   Labs (all labs ordered are listed, but only abnormal results are displayed) Labs Reviewed  PREGNANCY, URINE  CBC WITH DIFFERENTIAL/PLATELET  COMPREHENSIVE METABOLIC PANEL  URINALYSIS, ROUTINE W REFLEX MICROSCOPIC    EKG None  Radiology No results found.  Procedures Procedures   Medications Ordered  in ED Medications  sodium chloride 0.9 % bolus 500 mL (has no administration in time range)  morphine 4 MG/ML injection 4 mg (has no administration in time range)  ondansetron (ZOFRAN) injection 4 mg (has no administration in time range)    ED Course  I have reviewed the triage vital signs and the nursing notes.  Pertinent labs & imaging results that were available during my care of the patient were reviewed by me and considered in my medical decision making (see chart for details).    MDM Rules/Calculators/A&P                          Vitals are stable on arrival, patient is afebrile.  She is moderately tender to palpation of the right lower quadrant with some rebound tenderness.  No active guarding.  Patient was seen about 10 days ago with similar complaint, pregnancy test was negative, work-up revealed yeast infection and bacterial vaginosis, she had an ultrasound imaging at that time.  Here her pregnancy test is positive.  Patient now admitting to being sexually active, not on any birth control, was unaware of the possibility of pregnancy.  Believes her last period was about 3 weeks ago.  Remainder of the lab evaluation is unremarkable.  We do not have ultrasound at this time at this facility but main concern would be to rule out ectopic pregnancy.  Patient continues to deny any vaginal bleeding/discharge.  At this time our lab machines are down, we cannot perform hCG quant.  Of note, since the patient had a negative pregnancy test about 10 days ago, my suspicion for pregnancy was low, pain medicine was originally ordered with the idea to hold its administration until the results of the repeat pregnancy test today.  Unfortunately this medicine was given before the results of the pregnancy test.  Patient is aware of this pain medication administration in the setting of pregnancy. All questions answered.  We will transfer the patient by ambulance to [facility], ER for further evaluation and  care.  Dr. Rodena Medin is accepting physician, patient's vitals are stable at time of transfer.  Final Clinical Impression(s) / ED Diagnoses Final diagnoses:  None    Rx / DC Orders ED Discharge Orders    None       Rozelle Logan, DO 04/21/21 2440    Rozelle Logan, DO 04/21/21 1157    Joanny Dupree, Clabe Seal, DO 04/21/21 1222

## 2021-04-21 NOTE — ED Triage Notes (Signed)
Pt reports GI issues in past, maybe intolerance to gluten. Abd pain right lower quadrant started yesterday, constant, now hurting so much she vomited. Unable to keep anything down. Denies diarrhea or fever.

## 2021-04-21 NOTE — Discharge Instructions (Signed)
Return to care  If you have heavier bleeding that soaks through more that 2 pads per hour for an hour or more If you bleed so much that you feel like you might pass out or you do pass out If you have significant abdominal pain that is not improved with Tylenol     Safe Medications in Pregnancy   Acne: Benzoyl Peroxide Salicylic Acid  Backache/Headache: Tylenol: 2 regular strength every 4 hours OR              2 Extra strength every 6 hours  Colds/Coughs/Allergies: Benadryl (alcohol free) 25 mg every 6 hours as needed Breath right strips Claritin Cepacol throat lozenges Chloraseptic throat spray Cold-Eeze- up to three times per day Cough drops, alcohol free Flonase (by prescription only) Guaifenesin Mucinex Robitussin DM (plain only, alcohol free) Saline nasal spray/drops Sudafed (pseudoephedrine) & Actifed ** use only after [redacted] weeks gestation and if you do not have high blood pressure Tylenol Vicks Vaporub Zinc lozenges Zyrtec   Constipation: Colace Ducolax suppositories Fleet enema Glycerin suppositories Metamucil Milk of magnesia Miralax Senokot Smooth move tea  Diarrhea: Kaopectate Imodium A-D  *NO pepto Bismol  Hemorrhoids: Anusol Anusol HC Preparation H Tucks  Indigestion: Tums Maalox Mylanta Zantac  Pepcid  Insomnia: Benadryl (alcohol free) 25mg every 6 hours as needed Tylenol PM Unisom, no Gelcaps  Leg Cramps: Tums MagGel  Nausea/Vomiting:  Bonine Dramamine Emetrol Ginger extract Sea bands Meclizine  Nausea medication to take during pregnancy:  Unisom (doxylamine succinate 25 mg tablets) Take one tablet daily at bedtime. If symptoms are not adequately controlled, the dose can be increased to a maximum recommended dose of two tablets daily (1/2 tablet in the morning, 1/2 tablet mid-afternoon and one at bedtime). Vitamin B6 100mg tablets. Take one tablet twice a day (up to 200 mg per day).  Skin Rashes: Aveeno  products Benadryl cream or 25mg every 6 hours as needed Calamine Lotion 1% cortisone cream  Yeast infection: Gyne-lotrimin 7 Monistat 7  Gum/tooth pain: Anbesol  **If taking multiple medications, please check labels to avoid duplicating the same active ingredients **take medication as directed on the label ** Do not exceed 4000 mg of tylenol in 24 hours **Do not take medications that contain aspirin or ibuprofen    

## 2021-04-21 NOTE — MAU Provider Note (Signed)
History     CSN: [id number]  Arrival date and time: 04/21/21 0759   Event Date/Time   First Provider Initiated Contact with Patient 04/21/21 1116      Chief Complaint  Patient presents with  . Abdominal Pain   HPI Christine Brewer is a 20 y.o. G1P0 at [redacted]w[redacted]d who presents with abdominal pain. Was seen at [facility] ED this morning for RLQ abdominal pain & was transferred here after a positive urine pregnancy test.  Symptoms started yesterday. Constant pain in RLQ with associated nausea. Rates pain 4/10. No aggravating/alleviating symptoms. Hasn't treated symptoms. Felt like she had a fever yesterday. Last BM was yesterday and was normal. Denies dysuria, vaginal discharge, or vaginal bleeding.   OB History    Gravida  1   Para      Term      Preterm      AB      Living        SAB      IAB      Ectopic      Multiple      Live Births              Past Medical History:  Diagnosis Date  . Depression     Past Surgical History:  Procedure Laterality Date  . NO PAST SURGERIES      History reviewed. No pertinent family history.  Social History   Tobacco Use  . Smoking status: Never Smoker  . Smokeless tobacco: Never Used  Substance Use Topics  . Alcohol use: Never  . Drug use: Never    Allergies:  Allergies  Allergen Reactions  . Azithromycin Nausea Only, Rash and Other (See Comments)  . Penicillins Nausea Only, Rash and Other (See Comments)  . Cefdinir Other (See Comments)  . Gluten Meal     Medications Prior to Admission  Medication Sig Dispense Refill Last Dose  . FLUoxetine (PROZAC) 10 MG capsule Take 1 capsule (10 mg total) by mouth daily. 30 capsule 1 Past Month at Unknown time  . Multiple Vitamins-Minerals (MULTIVITAMIN WITH MINERALS) tablet Take 1 tablet by mouth daily.   04/20/2021 at Unknown time  . cetirizine (ZYRTEC) 10 MG tablet Take 10 mg by mouth daily.     . ondansetron (ZOFRAN) 4 MG tablet Take 1 tablet (4 mg total) by mouth every  6 (six) hours. 12 tablet 0     Review of Systems  Constitutional: Negative.   Gastrointestinal: Positive for abdominal pain, nausea and vomiting. Negative for constipation and diarrhea.  Genitourinary: Negative.    Physical Exam   Blood pressure (!) 110/58, pulse 82, temperature 99 F (37.2 C), temperature source Oral, resp. rate 16, height 5\' 4"  (1.626 m), weight 65 kg, last menstrual period 03/10/2021, SpO2 100 %.  Physical Exam Vitals and nursing note reviewed.  Constitutional:      General: She is not in acute distress.    Appearance: She is well-developed and normal weight.  HENT:     Head: Normocephalic and atraumatic.  Pulmonary:     Effort: Pulmonary effort is normal. No respiratory distress.  Abdominal:     General: Abdomen is flat.     Tenderness: There is abdominal tenderness in the right lower quadrant. There is rebound. There is no guarding.  Skin:    General: Skin is warm and dry.  Neurological:     Mental Status: She is alert.  Psychiatric:        Mood and Affect: Mood  normal.        Behavior: Behavior normal.     MAU Course  Procedures Results for orders placed or performed during the hospital encounter of 04/21/21 (from the past 24 hour(s))  Pregnancy, urine     Status: Abnormal   Collection Time: 04/21/21  8:06 AM  Result Value Ref Range   Preg Test, Ur POSITIVE (A) NEGATIVE  CBC with Differential     Status: None   Collection Time: 04/21/21  8:18 AM  Result Value Ref Range   WBC 8.0 4.0 - 10.5 K/uL   RBC 4.20 3.87 - 5.11 MIL/uL   Hemoglobin 12.2 12.0 - 15.0 g/dL   HCT 15.4 00.8 - 67.6 %   MCV 85.7 80.0 - 100.0 fL   MCH 29.0 26.0 - 34.0 pg   MCHC 33.9 30.0 - 36.0 g/dL   RDW 19.5 09.3 - 26.7 %   Platelets 308 150 - 400 K/uL   nRBC 0.0 0.0 - 0.2 %   Neutrophils Relative % 60 %   Neutro Abs 4.8 1.7 - 7.7 K/uL   Lymphocytes Relative 32 %   Lymphs Abs 2.6 0.7 - 4.0 K/uL   Monocytes Relative 7 %   Monocytes Absolute 0.5 0.1 - 1.0 K/uL    Eosinophils Relative 1 %   Eosinophils Absolute 0.1 0.0 - 0.5 K/uL   Basophils Relative 0 %   Basophils Absolute 0.0 0.0 - 0.1 K/uL   Immature Granulocytes 0 %   Abs Immature Granulocytes 0.01 0.00 - 0.07 K/uL  Comprehensive metabolic panel     Status: Abnormal   Collection Time: 04/21/21  8:18 AM  Result Value Ref Range   Sodium 137 135 - 145 mmol/L   Potassium 4.1 3.5 - 5.1 mmol/L   Chloride 105 98 - 111 mmol/L   CO2 24 22 - 32 mmol/L   Glucose, Bld 80 70 - 99 mg/dL   BUN 9 6 - 20 mg/dL   Creatinine, Ser 1.24 0.44 - 1.00 mg/dL   Calcium 9.0 8.9 - 58.0 mg/dL   Total Protein 6.3 (L) 6.5 - 8.1 g/dL   Albumin 4.1 3.5 - 5.0 g/dL   AST 17 15 - 41 U/L   ALT 14 0 - 44 U/L   Alkaline Phosphatase 42 38 - 126 U/L   Total Bilirubin 0.9 0.3 - 1.2 mg/dL   GFR, Estimated >99 >83 mL/min   Anion gap 8 5 - 15  Urinalysis, Routine w reflex microscopic Urine, Clean Catch     Status: Abnormal   Collection Time: 04/21/21  8:18 AM  Result Value Ref Range   Color, Urine YELLOW YELLOW   APPearance CLEAR CLEAR   Specific Gravity, Urine 1.024 1.005 - 1.030   pH 5.5 5.0 - 8.0   Glucose, UA NEGATIVE NEGATIVE mg/dL   Hgb urine dipstick NEGATIVE NEGATIVE   Bilirubin Urine NEGATIVE NEGATIVE   Ketones, ur >80 (A) NEGATIVE mg/dL   Protein, ur TRACE (A) NEGATIVE mg/dL   Nitrite NEGATIVE NEGATIVE   Leukocytes,Ua NEGATIVE NEGATIVE  CBC     Status: None   Collection Time: 04/21/21 11:23 AM  Result Value Ref Range   WBC 8.3 4.0 - 10.5 K/uL   RBC 4.30 3.87 - 5.11 MIL/uL   Hemoglobin 12.5 12.0 - 15.0 g/dL   HCT 38.2 50.5 - 39.7 %   MCV 87.7 80.0 - 100.0 fL   MCH 29.1 26.0 - 34.0 pg   MCHC 33.2 30.0 - 36.0 g/dL   RDW 67.3 41.9 -  15.5 %   Platelets 336 150 - 400 K/uL   nRBC 0.0 0.0 - 0.2 %  ABO/Rh     Status: None   Collection Time: 04/21/21 11:23 AM  Result Value Ref Range   ABO/RH(D) A POS    No rh immune globuloin      NOT A RH IMMUNE GLOBULIN CANDIDATE, PT RH POSITIVE Performed at [facility], [address] [place], [place] [address]   hCG, quantitative, pregnancy     Status: Abnormal   Collection Time: 04/21/21 11:23 AM  Result Value Ref Range   hCG, Beta Chain, Quant, S 397 (H) <5 mIU/mL   US OB LESS THAN 14 WEEKS WITH OB TRANSVAGINAL  Result Date: 04/21/2021 CLINICAL DATA:  Abdominal pain with positive pregnancy test. EXAM: OBSTETRIC <14 WK US AND TRANSVAGINAL OB US TECHNIQUE: Both transabdominal and transvaginal ultrasound examinations were performed for complete evaluation of the gestation as well as the maternal uterus, adnexal regions, and pelvic cul-de-sac. Transvaginal technique was performed to assess early pregnancy. COMPARISON:  None. FINDINGS: Intrauterine gestational sac: Not visualized. Yolk sac:  Not visualized. Embryo:  Not visualized. Maternal uterus/adnexae: Endometrial thickening noted up to 13 mm. Maternal ovaries unremarkable. Medially adjacent to the right ovary is a 10 mm thin walled cystic lesion with a small amount of adjacent simple appearing free fluid. This tiny cystic structure shows no internal architecture and is not hyperemic on color Doppler imaging. No substantial free fluid in the cul-de-sac. IMPRESSION: 1. No intrauterine gestational sac in this patient with a positive pregnancy test. 2. 10 mm thin walled cystic structure identified adjacent to the right ovary without internal architecture or substantial hyperemia on color Doppler evaluation. This is not a "classic" sonographic appearance for ectopic pregnancy, but ectopic gestation cannot be excluded. There is a tiny amount of adjacent simple appearing free fluid, but no substantial fluid in the cul-de-sac. 3. No findings to suggest hemoperitoneum. I personally called these results to a provider named Rayfield CitizenCaroline in the MAU at the time of study interpretation. Electronically Signed   By: Kennith CenterEric  Mansell M.D.   On: 04/21/2021 13:28    MDM +UPT UA, CBC, ABO/Rh, quant hCG, and US today to rule out  ectopic pregnancy which can be life threatening.   HCG 11 days ago was negative & she had a normal CT at that time - was evaluated due to complaint of RLQ abdominal pain. Also had GC & wet prep collected during that visit which was significant for BV & yeast - she has completed treatment.   Ultrasound shows no IUP -- 10 mm cystic structure in right adnexa, small simple free fluid HCG today is 397  Reviewed assessment, labs, & ultrasound with Dr. Vergie LivingPickens. Patient is stable. Will bring back on Tuesday to MAU for HCG & repeat ultrasound.   She does have tenderness in RLQ & some rebound - she is otherwise stable, afebrile, and no leukocytosis. She is resting comfortably in the bed & has not required pain medication during her visit. Ultrasound shows no signs of hemoperitoneum.  Assessment and Plan   1. Pregnancy of unknown anatomic location   2. Pregnancy, unspecified gestational age   643. RLQ abdominal pain   4. Abdominal pain during pregnancy in first trimester   5. [redacted] weeks gestation of pregnancy    -given strict return precautions related to possible ectopic pregnancy vs miscarriage -signed & held orders placed for HCG & TVUS to be completed in MAU on Tuesday  Judeth Horn 04/21/2021, 1:48 PM

## 2021-04-23 ENCOUNTER — Inpatient Hospital Stay (HOSPITAL_COMMUNITY)
Admission: AD | Admit: 2021-04-23 | Discharge: 2021-04-23 | Disposition: A | Discharge status: Home / Self Care | Payer: BC Managed Care – PPO | Attending: Obstetrics & Gynecology | Admitting: Obstetrics & Gynecology

## 2021-04-23 ENCOUNTER — Other Ambulatory Visit: Payer: Self-pay

## 2021-04-23 ENCOUNTER — Inpatient Hospital Stay (HOSPITAL_COMMUNITY): Payer: BC Managed Care – PPO

## 2021-04-23 DIAGNOSIS — Z3A01 Less than 8 weeks gestation of pregnancy: Secondary | ICD-10-CM | POA: Diagnosis not present

## 2021-04-23 DIAGNOSIS — O3680X0 Pregnancy with inconclusive fetal viability, not applicable or unspecified: Secondary | ICD-10-CM | POA: Insufficient documentation

## 2021-04-23 LAB — HCG, QUANTITATIVE, PREGNANCY: hCG, Beta Chain, Quant, S: 1075 m[IU]/mL — ABNORMAL HIGH (ref <5)

## 2021-04-23 NOTE — Discharge Instructions (Signed)
Return to care  If you have heavier bleeding that soaks through more that 2 pads per hour for an hour or more If you bleed so much that you feel like you might pass out or you do pass out If you have significant abdominal pain that is not improved with Tylenol   

## 2021-04-23 NOTE — MAU Note (Signed)
Kaitlynd Phillips is a 20 y.o. at [redacted]w[redacted]d here in MAU reporting: here for repeat hcg and u/s. Denies pain, bleeding, or discharge.  Onset of complaint: ongoing  Pain score: 0/10 Vitals:   04/23/21 1110  BP: 128/69  Pulse: 85  Resp: 16  Temp: 98.8 F (37.1 C)  SpO2: 100%     Lab orders placed from triage: hcg

## 2021-04-23 NOTE — MAU Provider Note (Signed)
History   Chief Complaint:  Follow-up   Christine Brewer is  20 y.o. G1P0 Patient's last menstrual period was 03/10/2021.[name] Patient is here for follow up of quantitative HCG and ongoing surveillance of pregnancy status. She is [redacted]w[redacted]d weeks gestation  by LMP.    Since her last visit, the patient is without new complaint. The patient reports bleeding as  none now.  She denies any pain.  General ROS:  negative  Her previous Quantitative HCG values are:  Component     Latest Ref Rng & Units 04/21/2021  HCG, Beta Chain, Quant, S     <5 mIU/mL 397 (H)     US OB LESS THAN 14 WEEKS WITH OB TRANSVAGINAL  Result Date: 04/21/2021 CLINICAL DATA:  Abdominal pain with positive pregnancy test. EXAM: OBSTETRIC <14 WK [place] AND TRANSVAGINAL OB US TECHNIQUE: Both transabdominal and transvaginal ultrasound examinations were performed for complete evaluation of the gestation as well as the maternal uterus, adnexal regions, and pelvic cul-de-sac. Transvaginal technique was performed to assess early pregnancy. COMPARISON:  None. FINDINGS: Intrauterine gestational sac: Not visualized. Yolk sac:  Not visualized. Embryo:  Not visualized. Maternal uterus/adnexae: Endometrial thickening noted up to 13 mm. Maternal ovaries unremarkable. Medially adjacent to the right ovary is a 10 mm thin walled cystic lesion with a small amount of adjacent simple appearing free fluid. This tiny cystic structure shows no internal architecture and is not hyperemic on color Doppler imaging. No substantial free fluid in the cul-de-sac. IMPRESSION: 1. No intrauterine gestational sac in this patient with a positive pregnancy test. 2. 10 mm thin walled cystic structure identified adjacent to the right ovary without internal architecture or substantial hyperemia on color Doppler evaluation. This is not a "classic" sonographic appearance for ectopic pregnancy, but ectopic gestation cannot be excluded. There is a tiny amount of adjacent simple appearing  free fluid, but no substantial fluid in the cul-de-sac. 3. No findings to suggest hemoperitoneum. I personally called these results to a provider named Rayfield Citizen in the MAU at the time of study interpretation. Electronically Signed   By: Kennith Center M.D.   On: 04/21/2021 13:28    Physical Exam   Blood pressure 128/69, pulse 85, temperature 98.8 F (37.1 C), resp. rate 16, last menstrual period 03/10/2021, SpO2 100 %.  Physical Examination: General appearance - alert, well appearing, and in no distress Mental status - normal mood, behavior, speech, dress, motor activity, and thought processes Eyes - sclera anicteric Chest - normal respiratory effort   Labs: Results for orders placed or performed during the hospital encounter of 04/23/21 (from the past 24 hour(s))  hCG, quantitative, pregnancy   Collection Time: 04/23/21 11:35 AM  Result Value Ref Range   hCG, Beta Chain, Quant, S 1,075 (H) <5 mIU/mL      Assessment:   1. Pregnancy of unknown anatomic location   2. [redacted] weeks gestation of pregnancy   -appropriate rise in HCG -Ultrasound now shows possible IUGS.  -reviewed with Dr. Macon Large - get one more hCG, if appropriate will get viability scan in 2 weeks    Plan: -Discharge home in stable condition -SAB vs ectopic precautions discussed -Patient advised to follow-up with MAU on Friday for repeat HCG - patient is unable to go to the office during office hours this week due to her work schedule -Patient may return to MAU as needed or if her condition were to change or worsen  Judeth Horn, NP 04/23/2021, 5:29 PM

## 2021-04-28 ENCOUNTER — Inpatient Hospital Stay (HOSPITAL_COMMUNITY)
Admission: AD | Admit: 2021-04-28 | Discharge: 2021-04-28 | Disposition: A | Discharge status: Home / Self Care | Payer: BC Managed Care – PPO | Attending: Obstetrics and Gynecology | Admitting: Obstetrics and Gynecology

## 2021-04-28 ENCOUNTER — Telehealth: Payer: Self-pay

## 2021-04-28 DIAGNOSIS — E349 Endocrine disorder, unspecified: Secondary | ICD-10-CM

## 2021-04-28 DIAGNOSIS — Z3A01 Less than 8 weeks gestation of pregnancy: Secondary | ICD-10-CM | POA: Insufficient documentation

## 2021-04-28 DIAGNOSIS — R109 Unspecified abdominal pain: Secondary | ICD-10-CM | POA: Insufficient documentation

## 2021-04-28 DIAGNOSIS — O26891 Other specified pregnancy related conditions, first trimester: Secondary | ICD-10-CM | POA: Diagnosis not present

## 2021-04-28 DIAGNOSIS — Z3491 Encounter for supervision of normal pregnancy, unspecified, first trimester: Secondary | ICD-10-CM | POA: Diagnosis not present

## 2021-04-28 DIAGNOSIS — O3680X0 Pregnancy with inconclusive fetal viability, not applicable or unspecified: Secondary | ICD-10-CM | POA: Insufficient documentation

## 2021-04-28 LAB — HCG, QUANTITATIVE, PREGNANCY: hCG, Beta Chain, Quant, S: 8172 m[IU]/mL — ABNORMAL HIGH (ref <5)

## 2021-04-28 NOTE — Telephone Encounter (Signed)
HIPPA compliant message left requesting patient return to MAU for repeat HCG.   Rolm Bookbinder, CNM 04/28/21 8:19 AM

## 2021-04-28 NOTE — MAU Provider Note (Signed)
History   Chief Complaint:  Follow-up and Abdominal Pain   Christine Brewer is  20 y.o. G1P0 Patient's last menstrual period was 03/10/2021.[name] Patient is here for follow up of quantitative HCG and ongoing surveillance of pregnancy status. She is [redacted]w[redacted]d weeks gestation  by LMP.    Since her last visit, the patient is without new complaint. The patient reports bleeding as  none now.  She denies any pain.  General ROS:  negative  Her previous Quantitative HCG values are:  Results for Christine, Brewer (MRN [id number]) as of 04/28/2021 19:19  Ref. Range 04/21/2021 11:23 04/21/2021 12:59 04/23/2021 11:35  HCG, Beta Chain, Quant, S Latest Ref Range: <5 mIU/mL 397 (H)  1,075 (H)   Physical Exam   Blood pressure 115/60, pulse 86, temperature 98.4 F (36.9 C), temperature source Oral, resp. rate 16, last menstrual period 03/10/2021, SpO2 100 %.  Physical Exam Vitals and nursing note reviewed.  Constitutional:      General: She is not in acute distress.    Appearance: She is normal weight. She is not ill-appearing.  Cardiovascular:     Rate and Rhythm: Normal rate.  Pulmonary:     Effort: Pulmonary effort is normal. No respiratory distress.  Abdominal:     General: Abdomen is flat.     Palpations: Abdomen is soft.  Neurological:     Mental Status: She is alert.  Psychiatric:        Mood and Affect: Mood normal. Mood is not anxious or depressed.        Behavior: Behavior normal.     Labs: Results for orders placed or performed during the hospital encounter of 04/28/21 (from the past 24 hour(s))  hCG, quantitative, pregnancy   Collection Time: 04/28/21  4:35 PM  Result Value Ref Range   hCG, Beta Chain, Quant, S 8,172 (H) <5 mIU/mL     Assessment:   1. Elevated serum hCG   2. [redacted] weeks gestation of pregnancy     Consulted with Dr. Macon Large- appropriate rise and recommends repeat u/s in 2 weeks Results reviewed with patient and agreeable to plan of care  Plan: -Discharge home in  stable condition -First trimester precautions discussed -Patient advised to follow-up with [facility] in 2 weeks for repeat ultrasound, order placed. -Patient may return to MAU as needed or if her condition were to change or worsen  Rolm Bookbinder, CNM 04/28/2021, 6:18 PM

## 2021-04-28 NOTE — MAU Note (Signed)
Pt reports to mau for follow up lab work.  Pt reports abd pain is about the same as her last mau visit. Denies vag bleeding

## 2021-04-29 ENCOUNTER — Telehealth: Payer: Self-pay | Admitting: Emergency Medicine

## 2021-04-29 NOTE — Telephone Encounter (Signed)
Letter returned, insufficient address, lost to followup

## 2021-05-21 ENCOUNTER — Telehealth (HOSPITAL_COMMUNITY): Payer: BC Managed Care – PPO | Admitting: Psychiatry

## 2021-05-21 ENCOUNTER — Telehealth (HOSPITAL_COMMUNITY): Payer: Self-pay | Admitting: Psychiatry

## 2021-05-21 NOTE — Telephone Encounter (Signed)
Patient scheduled for 9;30. Text invite was sent. Did not join, its 9;45. Will be a No show

## 2021-12-16 ENCOUNTER — Other Ambulatory Visit: Payer: Self-pay

## 2021-12-16 ENCOUNTER — Encounter (HOSPITAL_BASED_OUTPATIENT_CLINIC_OR_DEPARTMENT_OTHER): Payer: Self-pay | Admitting: *Deleted

## 2021-12-16 ENCOUNTER — Inpatient Hospital Stay (HOSPITAL_BASED_OUTPATIENT_CLINIC_OR_DEPARTMENT_OTHER)
Admission: EM | Admit: 2021-12-16 | Discharge: 2021-12-18 | DRG: 776 | Disposition: A | Discharge status: Home / Self Care | Payer: BC Managed Care – PPO | Attending: Internal Medicine | Admitting: Internal Medicine

## 2021-12-16 ENCOUNTER — Emergency Department (HOSPITAL_BASED_OUTPATIENT_CLINIC_OR_DEPARTMENT_OTHER): Payer: BC Managed Care – PPO

## 2021-12-16 DIAGNOSIS — O159 Eclampsia, unspecified as to time period: Secondary | ICD-10-CM | POA: Diagnosis not present

## 2021-12-16 DIAGNOSIS — O1415 Severe pre-eclampsia, complicating the puerperium: Secondary | ICD-10-CM | POA: Diagnosis present

## 2021-12-16 DIAGNOSIS — O9953 Diseases of the respiratory system complicating the puerperium: Secondary | ICD-10-CM | POA: Diagnosis present

## 2021-12-16 DIAGNOSIS — E8770 Fluid overload, unspecified: Secondary | ICD-10-CM

## 2021-12-16 DIAGNOSIS — Z20822 Contact with and (suspected) exposure to covid-19: Secondary | ICD-10-CM | POA: Diagnosis not present

## 2021-12-16 DIAGNOSIS — J9601 Acute respiratory failure with hypoxia: Secondary | ICD-10-CM | POA: Diagnosis not present

## 2021-12-16 DIAGNOSIS — Z8616 Personal history of COVID-19: Secondary | ICD-10-CM

## 2021-12-16 DIAGNOSIS — E876 Hypokalemia: Secondary | ICD-10-CM | POA: Diagnosis present

## 2021-12-16 DIAGNOSIS — O1490 Unspecified pre-eclampsia, unspecified trimester: Secondary | ICD-10-CM

## 2021-12-16 DIAGNOSIS — R0603 Acute respiratory distress: Secondary | ICD-10-CM | POA: Diagnosis not present

## 2021-12-16 DIAGNOSIS — O99285 Endocrine, nutritional and metabolic diseases complicating the puerperium: Secondary | ICD-10-CM | POA: Diagnosis present

## 2021-12-16 DIAGNOSIS — O1495 Unspecified pre-eclampsia, complicating the puerperium: Secondary | ICD-10-CM | POA: Diagnosis present

## 2021-12-16 DIAGNOSIS — O903 Peripartum cardiomyopathy: Secondary | ICD-10-CM | POA: Insufficient documentation

## 2021-12-16 DIAGNOSIS — R0902 Hypoxemia: Secondary | ICD-10-CM

## 2021-12-16 DIAGNOSIS — J810 Acute pulmonary edema: Secondary | ICD-10-CM

## 2021-12-16 DIAGNOSIS — E8779 Other fluid overload: Secondary | ICD-10-CM

## 2021-12-16 DIAGNOSIS — J811 Chronic pulmonary edema: Secondary | ICD-10-CM

## 2021-12-16 HISTORY — DX: Unspecified pre-eclampsia, unspecified trimester: O14.90

## 2021-12-16 LAB — CBC WITH DIFFERENTIAL/PLATELET
Abs Immature Granulocytes: 0.09 10*3/uL — ABNORMAL HIGH (ref 0.00–0.07)
Basophils Absolute: 0 10*3/uL (ref 0.0–0.1)
Basophils Relative: 0 %
Eosinophils Absolute: 0.2 10*3/uL (ref 0.0–0.5)
Eosinophils Relative: 1 %
HCT: 30.4 % — ABNORMAL LOW (ref 36.0–46.0)
Hemoglobin: 9.7 g/dL — ABNORMAL LOW (ref 12.0–15.0)
Immature Granulocytes: 1 %
Lymphocytes Relative: 6 %
Lymphs Abs: 1.1 10*3/uL (ref 0.7–4.0)
MCH: 26.4 pg (ref 26.0–34.0)
MCHC: 31.9 g/dL (ref 30.0–36.0)
MCV: 82.6 fL (ref 80.0–100.0)
Monocytes Absolute: 0.5 10*3/uL (ref 0.1–1.0)
Monocytes Relative: 3 %
Neutro Abs: 16.2 10*3/uL — ABNORMAL HIGH (ref 1.7–7.7)
Neutrophils Relative %: 89 %
Platelets: 398 10*3/uL (ref 150–400)
RBC: 3.68 MIL/uL — ABNORMAL LOW (ref 3.87–5.11)
RDW: 15.6 % — ABNORMAL HIGH (ref 11.5–15.5)
WBC: 18 10*3/uL — ABNORMAL HIGH (ref 4.0–10.5)
nRBC: 0 % (ref 0.0–0.2)

## 2021-12-16 LAB — URINALYSIS, ROUTINE W REFLEX MICROSCOPIC

## 2021-12-16 LAB — COMPREHENSIVE METABOLIC PANEL
ALT: 15 U/L (ref 0–44)
AST: 19 U/L (ref 15–41)
Albumin: 3.5 g/dL (ref 3.5–5.0)
Alkaline Phosphatase: 91 U/L (ref 38–126)
Anion gap: 10 (ref 5–15)
BUN: 13 mg/dL (ref 6–20)
CO2: 21 mmol/L — ABNORMAL LOW (ref 22–32)
Calcium: 8.3 mg/dL — ABNORMAL LOW (ref 8.9–10.3)
Chloride: 108 mmol/L (ref 98–111)
Creatinine, Ser: 0.63 mg/dL (ref 0.44–1.00)
GFR, Estimated: >60 mL/min (ref >60)
Glucose, Bld: 83 mg/dL (ref 70–99)
Potassium: 3.7 mmol/L (ref 3.5–5.1)
Sodium: 139 mmol/L (ref 135–145)
Total Bilirubin: 0.6 mg/dL (ref 0.3–1.2)
Total Protein: 6.3 g/dL — ABNORMAL LOW (ref 6.5–8.1)

## 2021-12-16 LAB — URINALYSIS, MICROSCOPIC (REFLEX)
RBC / HPF: >50 RBC/hpf (ref 0–5)
WBC, UA: >50 WBC/hpf (ref 0–5)

## 2021-12-16 LAB — RESP PANEL BY RT-PCR (FLU A&B, COVID) ARPGX2
Influenza A by PCR: NEGATIVE
Influenza B by PCR: NEGATIVE
SARS Coronavirus 2 by RT PCR: NEGATIVE

## 2021-12-16 LAB — STREP PNEUMONIAE URINARY ANTIGEN: Strep Pneumo Urinary Antigen: NEGATIVE

## 2021-12-16 LAB — PROTIME-INR
INR: 1 (ref 0.8–1.2)
Prothrombin Time: 13 seconds (ref 11.4–15.2)

## 2021-12-16 LAB — GLUCOSE, CAPILLARY: Glucose-Capillary: 82 mg/dL (ref 70–99)

## 2021-12-16 LAB — BRAIN NATRIURETIC PEPTIDE: B Natriuretic Peptide: 436.5 pg/mL — ABNORMAL HIGH (ref 0.0–100.0)

## 2021-12-16 LAB — D-DIMER, QUANTITATIVE: D-Dimer, Quant: 2.38 ug/mL-FEU — ABNORMAL HIGH (ref 0.00–0.50)

## 2021-12-16 LAB — APTT: aPTT: 33 seconds (ref 24–36)

## 2021-12-16 LAB — LACTIC ACID, PLASMA: Lactic Acid, Venous: 0.8 mmol/L (ref 0.5–1.9)

## 2021-12-16 MED ORDER — LABETALOL HCL 5 MG/ML IV SOLN: 20.0000 mg | INTRAVENOUS | Status: DC | PRN

## 2021-12-16 MED ORDER — VANCOMYCIN HCL 1.25 G IV SOLR
1250.0000 mg | Freq: Two times a day (BID) | INTRAVENOUS | Status: DC
  Filled 2021-12-16: qty 25

## 2021-12-16 MED ORDER — HYDRALAZINE HCL 20 MG/ML IJ SOLN: 5.0000 mg | INTRAMUSCULAR | Status: DC | PRN

## 2021-12-16 MED ORDER — DOCUSATE SODIUM 100 MG PO CAPS: 100.0000 mg | ORAL_CAPSULE | Freq: Two times a day (BID) | ORAL | Status: DC | PRN

## 2021-12-16 MED ORDER — VANCOMYCIN HCL 10 G IV SOLR
1750.0000 mg | Freq: Once | INTRAVENOUS | Status: DC
  Filled 2021-12-16: qty 17.5

## 2021-12-16 MED ORDER — FUROSEMIDE 10 MG/ML IJ SOLN
40.0000 mg | INTRAMUSCULAR | Status: AC
  Administered 2021-12-16: 20:00:00 40 mg via INTRAVENOUS

## 2021-12-16 MED ORDER — IOHEXOL 350 MG/ML SOLN
80.0000 mL | Freq: Once | INTRAVENOUS | Status: AC | PRN
  Administered 2021-12-16: 14:00:00 80 mL via INTRAVENOUS

## 2021-12-16 MED ORDER — MAGNESIUM SULFATE 2 GM/50ML IV SOLN
2.0000 g | INTRAVENOUS | Status: AC
  Filled 2021-12-16 (×2): qty 50

## 2021-12-16 MED ORDER — FUROSEMIDE 10 MG/ML IJ SOLN
INTRAMUSCULAR | Status: AC
  Filled 2021-12-16: qty 4

## 2021-12-16 MED ORDER — VANCOMYCIN HCL IN DEXTROSE 1-5 GM/200ML-% IV SOLN
1000.0000 mg | INTRAVENOUS | Status: AC
  Administered 2021-12-16 (×2): 1000 mg via INTRAVENOUS
  Filled 2021-12-16 (×2): qty 200

## 2021-12-16 MED ORDER — LABETALOL HCL 5 MG/ML IV SOLN: 10.0000 mg | INTRAVENOUS | Status: DC | PRN

## 2021-12-16 MED ORDER — LACTATED RINGERS IV BOLUS (SEPSIS)
1000.0000 mL | Freq: Once | INTRAVENOUS | Status: AC
  Administered 2021-12-16: 15:00:00 1000 mL via INTRAVENOUS

## 2021-12-16 MED ORDER — ACETAMINOPHEN 500 MG PO TABS
1000.0000 mg | ORAL_TABLET | Freq: Four times a day (QID) | ORAL | Status: DC | PRN
  Administered 2021-12-16 – 2021-12-18 (×2): 1000 mg via ORAL
  Filled 2021-12-16 (×2): qty 2

## 2021-12-16 MED ORDER — VANCOMYCIN HCL IN DEXTROSE 1-5 GM/200ML-% IV SOLN: 1000.0000 mg | Freq: Two times a day (BID) | INTRAVENOUS | Status: DC

## 2021-12-16 MED ORDER — SODIUM CHLORIDE 0.9 % IV SOLN
2.0000 g | Freq: Once | INTRAVENOUS | Status: AC
  Administered 2021-12-16: 14:00:00 2 g via INTRAVENOUS

## 2021-12-16 MED ORDER — ENOXAPARIN SODIUM 40 MG/0.4ML IJ SOSY: 40.0000 mg | PREFILLED_SYRINGE | INTRAMUSCULAR | Status: DC

## 2021-12-16 MED ORDER — LABETALOL HCL 5 MG/ML IV SOLN: 40.0000 mg | INTRAVENOUS | Status: DC | PRN

## 2021-12-16 MED ORDER — ALBUTEROL SULFATE (2.5 MG/3ML) 0.083% IN NEBU
5.0000 mg | INHALATION_SOLUTION | Freq: Once | RESPIRATORY_TRACT | Status: AC
  Administered 2021-12-16: 13:00:00 5 mg via RESPIRATORY_TRACT
  Filled 2021-12-16: qty 6

## 2021-12-16 MED ORDER — POLYETHYLENE GLYCOL 3350 17 G PO PACK: 17.0000 g | PACK | Freq: Every day | ORAL | Status: DC | PRN

## 2021-12-16 MED ORDER — MAGNESIUM SULFATE 2 GM/50ML IV SOLN
2.0000 g | INTRAVENOUS | Status: AC
  Administered 2021-12-16 (×2): 2 g via INTRAVENOUS

## 2021-12-16 MED ORDER — LACTATED RINGERS IV BOLUS (SEPSIS): 1000.0000 mL | Freq: Once | INTRAVENOUS | Status: DC

## 2021-12-16 MED ORDER — ENOXAPARIN SODIUM 40 MG/0.4ML IJ SOSY
40.0000 mg | PREFILLED_SYRINGE | INTRAMUSCULAR | Status: DC
  Administered 2021-12-16 – 2021-12-17 (×2): 40 mg via SUBCUTANEOUS
  Filled 2021-12-16 (×2): qty 0.4

## 2021-12-16 MED ORDER — MAGNESIUM SULFATE 40 GM/1000ML IV SOLN
2.0000 g/h | INTRAVENOUS | Status: DC
  Administered 2021-12-16: 22:00:00 2 g/h via INTRAVENOUS
  Filled 2021-12-16 (×2): qty 1000

## 2021-12-16 MED ORDER — VANCOMYCIN HCL IN DEXTROSE 1-5 GM/200ML-% IV SOLN: 1000.0000 mg | Freq: Once | INTRAVENOUS | Status: DC

## 2021-12-16 MED ORDER — METRONIDAZOLE 500 MG/100ML IV SOLN
500.0000 mg | Freq: Once | INTRAVENOUS | Status: AC
  Administered 2021-12-16: 13:00:00 500 mg via INTRAVENOUS
  Filled 2021-12-16: qty 100

## 2021-12-16 MED ORDER — LIDOCAINE HCL URETHRAL/MUCOSAL 2 % EX GEL
1.0000 "application " | Freq: Once | CUTANEOUS | Status: AC
  Administered 2021-12-16: 1 via URETHRAL
  Filled 2021-12-16: qty 6

## 2021-12-16 MED ORDER — HYDRALAZINE HCL 20 MG/ML IJ SOLN: 10.0000 mg | INTRAMUSCULAR | Status: DC | PRN

## 2021-12-16 MED ORDER — SODIUM CHLORIDE 0.9 % IV SOLN
2.0000 g | Freq: Three times a day (TID) | INTRAVENOUS | Status: DC
  Administered 2021-12-16 – 2021-12-18 (×5): 2 g via INTRAVENOUS
  Filled 2021-12-16 (×8): qty 2

## 2021-12-16 MED ORDER — LABETALOL HCL 100 MG PO TABS
100.0000 mg | ORAL_TABLET | Freq: Two times a day (BID) | ORAL | Status: DC
  Administered 2021-12-16 – 2021-12-18 (×4): 100 mg via ORAL
  Filled 2021-12-16 (×4): qty 1

## 2021-12-16 MED ORDER — LACTATED RINGERS IV SOLN: INTRAVENOUS | Status: DC

## 2021-12-16 MED ORDER — POTASSIUM CHLORIDE CRYS ER 20 MEQ PO TBCR
40.0000 meq | EXTENDED_RELEASE_TABLET | Freq: Once | ORAL | Status: AC
  Administered 2021-12-16: 22:00:00 40 meq via ORAL
  Filled 2021-12-16: qty 2

## 2021-12-16 MED ORDER — SODIUM CHLORIDE 0.9 % IV SOLN
INTRAVENOUS | Status: DC | PRN
  Administered 2021-12-16: 13:00:00 10 mL via INTRAVENOUS

## 2021-12-16 MED ORDER — MAGNESIUM SULFATE BOLUS VIA INFUSION: 4.0000 g | Freq: Once | INTRAVENOUS | Status: DC

## 2021-12-16 NOTE — ED Triage Notes (Signed)
Pt presents with SHOB coughing bright red blood, Vag delivery last Wednesday. Coarse breath sounds. Labored resp.

## 2021-12-16 NOTE — ED Notes (Signed)
Approximately 300 mL one occurence urine on bedside commode

## 2021-12-16 NOTE — Lactation Note (Signed)
Lactation Consultation Note Mom delivered 5 days ago. Has been BF then pumping if needed if breast still full per mom. Mom hadn't pumped or BF since am. Baby is home w/grandmother. Mom's breast are leaking, painful, and hard. LC took DEBP, kit, and bottles to mom. LC reviewed pumping, how pump works, Manufacturing engineer and labeling. Asked RN to call pharmacy for milk labels. LC placed pump on mom's breast keeping her covered. LC massaged breast w/mom's permission to assist in emptying engorged breast. Mom pumped 120 ml -(4 oz). Praised mom. Mom stated breast feel much better. Breast weren't emptied but no knots or hardness noted. Encouraged mom to pump at least every 3 hrs but sooner if breast need. 2-3 hrs. Instructed not to pump every 2 hrs all the time so mom doesn't end up with milk over load.  LC got DEBP from Peds.    Patient Name: Christine Brewer Today's Date: 12/16/2021 Reason for consult: Initial assessment Age:20 y.o.  Maternal Data    Feeding    LATCH Score          Comfort (Breast/Nipple): Engorged, cracked, bleeding, large blisters, severe discomfort (engorged)         Lactation Tools Discussed/Used Tools: Pump Breast pump type: Double-Electric Breast Pump Pump Education: Setup, frequency, and cleaning;Milk Storage Pumping frequency: Q2-3 hrs Pumped volume: 120 mL (ml  4 oz.)  Interventions Interventions: DEBP;Breast massage;Breast compression  Discharge    Consult Status Consult Status: PRN Date: 12/17/21 Follow-up type: In-patient    Theodoro Kalata 12/16/2021, 10:01 PM

## 2021-12-16 NOTE — ED Notes (Signed)
Pt voided 2nd occurrence approx 100 mL, Color yellow with slight pink tinge.

## 2021-12-16 NOTE — Consult Note (Addendum)
Reason for Consult:postpartum preeclampsia and pulmonary edema Referring Physician: Ardeth Perfect MD  Christine Brewer is an 20 y.o. female. G1P1 postpartum 5 days after SVD at [facility] in [place]. She was not diagnosed with hypertension but had BP 147/84 in labor, 136/83 postpartum. She developed swelling and SOG on PP day 2 and worsened over the weekend, reporting to [facility] today with edema and hypoxia and severe-range BP, with postpartum pre-eclampsia. She stopped breastfeeding recently. Currently admitted to CVICU on O2 6 l [place].   Pertinent Gynecological History: Bleeding: normal lochia Blood transfusions: none Sexually transmitted diseases: no past history    Menstrual History:  Patient's last menstrual period was 03/10/2021.    Past Medical History:  Diagnosis Date   Depression    Pre-eclampsia     Past Surgical History:  Procedure Laterality Date   NO PAST SURGERIES      No family history on file.  Social History:  reports that she has never smoked. She has never used smokeless tobacco. She reports that she does not drink alcohol and does not use drugs.  Allergies:  Allergies  Allergen Reactions   Azithromycin Nausea Only, Rash and Other (See Comments)   Penicillins Nausea Only, Rash and Other (See Comments)   Cefdinir Other (See Comments)   Gluten Meal     Medications: I have reviewed the patient's current medications.  Review of Systems  Respiratory:  Positive for cough and shortness of breath.   Cardiovascular:  Negative for chest pain.  Gastrointestinal: Negative.   Genitourinary:  Positive for vaginal bleeding.   Blood pressure (!) 156/97, pulse (!) 112, temperature 99.6 F (37.6 C), temperature source Oral, resp. rate (!) 35, height 5\' 4"  (1.626 m), weight 86.2 kg, last menstrual period 03/10/2021, SpO2 95 %. Physical Exam Vitals and nursing note reviewed.  Constitutional:      Appearance: She is well-developed.  HENT:     Head: Normocephalic and  atraumatic.  Cardiovascular:     Rate and Rhythm: Tachycardia present.  Pulmonary:     Comments: Breathing comfortably with [place] O2 Musculoskeletal:     Right lower leg: Edema present.     Left lower leg: Edema present.  Skin:    General: Skin is warm and dry.  Neurological:     General: No focal deficit present.     Mental Status: She is alert.  Psychiatric:        Mood and Affect: Mood normal.        Behavior: Behavior normal.    Results for orders placed or performed during the hospital encounter of 12/16/21 (from the past 48 hour(s))  Resp Panel by RT-PCR (Flu A&B, Covid) Nasopharyngeal Swab     Status: None   Collection Time: 12/16/21 11:43 AM   Specimen: Nasopharyngeal Swab; Nasopharyngeal(NP) swabs in vial transport medium  Result Value Ref Range   SARS Coronavirus 2 by RT PCR NEGATIVE NEGATIVE    Comment: (NOTE) SARS-CoV-2 target nucleic acids are NOT DETECTED.  The SARS-CoV-2 RNA is generally detectable in upper respiratory specimens during the acute phase of infection. The lowest concentration of SARS-CoV-2 viral copies this assay can detect is 138 copies/mL. A negative result does not preclude SARS-Cov-2 infection and should not be used as the sole basis for treatment or other patient management decisions. A negative result may occur with  improper specimen collection/handling, submission of specimen other than nasopharyngeal swab, presence of viral mutation(s) within the areas targeted by this assay, and inadequate number of viral  copies(<138 copies/mL). A negative result must be combined with clinical observations, patient history, and epidemiological information. The expected result is Negative.  Fact Sheet for Patients:  BloggerCourse.com  Fact Sheet for Healthcare Providers:  SeriousBroker.it  This test is no t yet approved or cleared by the [place] FDA and  has been authorized for detection and/or  diagnosis of SARS-CoV-2 by FDA under an Emergency Use Authorization (EUA). This EUA will remain  in effect (meaning this test can be used) for the duration of the COVID-19 declaration under Section 564(b)(1) of the Act, 21 U.S.C.section 360bbb-3(b)(1), unless the authorization is terminated  or revoked sooner.       Influenza A by PCR NEGATIVE NEGATIVE   Influenza B by PCR NEGATIVE NEGATIVE    Comment: (NOTE) The Xpert Xpress SARS-CoV-2/FLU/RSV plus assay is intended as an aid in the diagnosis of influenza from Nasopharyngeal swab specimens and should not be used as a sole basis for treatment. Nasal washings and aspirates are unacceptable for Xpert Xpress SARS-CoV-2/FLU/RSV testing.  Fact Sheet for Patients: BloggerCourse.com  Fact Sheet for Healthcare Providers: SeriousBroker.it  This test is not yet approved or cleared by the [place] FDA and has been authorized for detection and/or diagnosis of SARS-CoV-2 by FDA under an Emergency Use Authorization (EUA). This EUA will remain in effect (meaning this test can be used) for the duration of the COVID-19 declaration under Section 564(b)(1) of the Act, 21 U.S.C. section 360bbb-3(b)(1), unless the authorization is terminated or revoked.  Performed at [facility], [address], [place], [place] [address]   Comprehensive metabolic panel     Status: Abnormal   Collection Time: 12/16/21 12:03 PM  Result Value Ref Range   Sodium 139 135 - 145 mmol/L   Potassium 3.7 3.5 - 5.1 mmol/L   Chloride 108 98 - 111 mmol/L   CO2 21 (L) 22 - 32 mmol/L   Glucose, Bld 83 70 - 99 mg/dL    Comment: Glucose reference range applies only to samples taken after fasting for at least 8 hours.   BUN 13 6 - 20 mg/dL   Creatinine, Ser 3.01 0.44 - 1.00 mg/dL   Calcium 8.3 (L) 8.9 - 10.3 mg/dL   Total Protein 6.3 (L) 6.5 - 8.1 g/dL   Albumin 3.5 3.5 - 5.0 g/dL   AST 19 15 - 41  U/L   ALT 15 0 - 44 U/L   Alkaline Phosphatase 91 38 - 126 U/L   Total Bilirubin 0.6 0.3 - 1.2 mg/dL   GFR, Estimated >31 >43 mL/min    Comment: (NOTE) Calculated using the CKD-EPI Creatinine Equation (2021)    Anion gap 10 5 - 15    Comment: Performed at [facility], [address], [place], [place] [address]  Lactic acid, plasma     Status: None   Collection Time: 12/16/21 12:03 PM  Result Value Ref Range   Lactic Acid, Venous 0.8 0.5 - 1.9 mmol/L    Comment: Performed at [facility], [address], [place], [place] [address]  CBC with Differential     Status: Abnormal   Collection Time: 12/16/21 12:03 PM  Result Value Ref Range   WBC 18.0 (H) 4.0 - 10.5 K/uL   RBC 3.68 (L) 3.87 - 5.11 MIL/uL   Hemoglobin 9.7 (L) 12.0 - 15.0 g/dL   HCT 20.6 (L) 01.5 - 61.5 %   MCV 82.6 80.0 - 100.0 fL   MCH 26.4 26.0 - 34.0 pg  MCHC 31.9 30.0 - 36.0 g/dL   RDW 37.0 (H) 48.8 - 89.1 %   Platelets 398 150 - 400 K/uL   nRBC 0.0 0.0 - 0.2 %   Neutrophils Relative % 89 %   Neutro Abs 16.2 (H) 1.7 - 7.7 K/uL   Lymphocytes Relative 6 %   Lymphs Abs 1.1 0.7 - 4.0 K/uL   Monocytes Relative 3 %   Monocytes Absolute 0.5 0.1 - 1.0 K/uL   Eosinophils Relative 1 %   Eosinophils Absolute 0.2 0.0 - 0.5 K/uL   Basophils Relative 0 %   Basophils Absolute 0.0 0.0 - 0.1 K/uL   Immature Granulocytes 1 %   Abs Immature Granulocytes 0.09 (H) 0.00 - 0.07 K/uL    Comment: Performed at [facility], [address] [place], [place] [address]  Protime-INR     Status: None   Collection Time: 12/16/21 12:03 PM  Result Value Ref Range   Prothrombin Time 13.0 11.4 - 15.2 seconds   INR 1.0 0.8 - 1.2    Comment: (NOTE) INR goal varies based on device and disease states. Performed at [facility], [address] [place], [place] [address]   Urinalysis, Routine w reflex microscopic     Status: Abnormal   Collection Time: 12/16/21  12:03 PM  Result Value Ref Range   Color, Urine RED (A) YELLOW    Comment: BIOCHEMICALS MAY BE AFFECTED BY COLOR   APPearance TURBID (A) CLEAR   Specific Gravity, Urine  1.005 - 1.030    TEST NOT REPORTED DUE TO COLOR INTERFERENCE OF URINE PIGMENT   pH  5.0 - 8.0    TEST NOT REPORTED DUE TO COLOR INTERFERENCE OF URINE PIGMENT   Glucose, UA (A) NEGATIVE mg/dL    TEST NOT REPORTED DUE TO COLOR INTERFERENCE OF URINE PIGMENT   Hgb urine dipstick (A) NEGATIVE    TEST NOT REPORTED DUE TO COLOR INTERFERENCE OF URINE PIGMENT   Bilirubin Urine (A) NEGATIVE    TEST NOT REPORTED DUE TO COLOR INTERFERENCE OF URINE PIGMENT   Ketones, ur (A) NEGATIVE mg/dL    TEST NOT REPORTED DUE TO COLOR INTERFERENCE OF URINE PIGMENT   Protein, ur (A) NEGATIVE mg/dL    TEST NOT REPORTED DUE TO COLOR INTERFERENCE OF URINE PIGMENT   Nitrite (A) NEGATIVE    TEST NOT REPORTED DUE TO COLOR INTERFERENCE OF URINE PIGMENT   Leukocytes,Ua (A) NEGATIVE    TEST NOT REPORTED DUE TO COLOR INTERFERENCE OF URINE PIGMENT    Comment: Performed at [facility], [address] [place], [place] [address]  D-dimer, quantitative     Status: Abnormal   Collection Time: 12/16/21 12:03 PM  Result Value Ref Range   D-Dimer, Quant 2.38 (H) 0.00 - 0.50 ug/mL-FEU    Comment: (NOTE) At the manufacturer cut-off value of 0.5 g/mL FEU, this assay has a negative predictive value of 95-100%.This assay is intended for use in conjunction with a clinical pretest probability (PTP) assessment model to exclude pulmonary embolism (PE) and deep venous thrombosis (DVT) in outpatients suspected of PE or DVT. Results should be correlated with clinical presentation. Performed at [facility], [address], [place], [place] [address]   APTT     Status: None   Collection Time: 12/16/21 12:03 PM  Result Value Ref Range   aPTT 33 24 - 36 seconds    Comment: Performed at [facility], [address], [place], [place] [address]  Brain natriuretic peptide  Status: Abnormal   Collection Time: 12/16/21 12:03 PM  Result Value Ref Range   B Natriuretic Peptide 436.5 (H) 0.0 - 100.0 pg/mL    Comment: Performed at [facility], [address], [place], [place] [address]  Urinalysis, Microscopic (reflex)     Status: Abnormal   Collection Time: 12/16/21 12:03 PM  Result Value Ref Range   RBC / HPF >50 0 - 5 RBC/hpf   WBC, UA >50 0 - 5 WBC/hpf   Bacteria, UA FEW (A) NONE SEEN   Squamous Epithelial / LPF 6-10 0 - 5   Urine-Other LESS THAN 10 mL OF URINE SUBMITTED     Comment: MICROSCOPIC EXAM PERFORMED ON UNCONCENTRATED URINE Performed at [facility], [address] [place], [place] [address]   Glucose, capillary     Status: None   Collection Time: 12/16/21  7:55 PM  Result Value Ref Range   Glucose-Capillary 82 70 - 99 mg/dL    Comment: Glucose reference range applies only to samples taken after fasting for at least 8 hours.    CT Angio Chest PE W and/or Wo Contrast  Result Date: 12/16/2021 CLINICAL DATA:  Cough, shortness of breath, hemoptysis, leukocytosis EXAM: CT ANGIOGRAPHY CHEST WITH CONTRAST TECHNIQUE: Multidetector CT imaging of the chest was performed using the standard protocol during bolus administration of intravenous contrast. Multiplanar CT image reconstructions and MIPs were obtained to evaluate the vascular anatomy. CONTRAST:  80mL OMNIPAQUE IOHEXOL 350 MG/ML SOLN COMPARISON:  Same day chest x-ray FINDINGS: Cardiovascular: Satisfactory opacification of the pulmonary arteries to the segmental level. No evidence of pulmonary embolism. Thoracic aorta is normal in course and caliber. Normal heart size. No pericardial effusion. Mediastinum/Nodes: No enlarged mediastinal, hilar, or axillary lymph nodes. Thyroid gland, trachea, and esophagus demonstrate no significant findings. Lungs/Pleura: Extensive patchy airspace  consolidations throughout both lungs with air bronchograms as well as surrounding ground-glass opacity. Findings are most confluent within the right upper lobe. Small bilateral pleural effusions, right slightly greater than left. No pneumothorax. Upper Abdomen: No acute abnormality. Musculoskeletal: No chest wall abnormality. No acute or significant osseous findings. Review of the MIP images confirms the above findings. IMPRESSION: 1. No evidence of pulmonary embolism. 2. Extensive patchy airspace consolidations throughout both lungs, most confluent within the right upper lobe. Findings are most compatible with multifocal pneumonia including atypical or viral etiologies. 3. Small bilateral pleural effusions, right slightly greater than left. Electronically Signed   By: Duanne Guess D.O.   On: 12/16/2021 14:55   DG Chest Portable 1 View  Result Date: 12/16/2021 CLINICAL DATA:  Fever, shortness of breath, hemoptysis EXAM: PORTABLE CHEST 1 VIEW COMPARISON:  Images of previous study done on 04/26/2020 are not available for review. Report for the previous study is available for review. FINDINGS: Cardiac size is within normal limits. Extensive patchy alveolar densities seen in both lungs, more so on the right side. Right lateral costophrenic angle is indistinct. There is no pneumothorax. IMPRESSION: Extensive patchy alveolar infiltrates are seen in both lungs suggesting extensive bilateral pneumonia. Possible small right pleural effusion. Electronically Signed   By: Ernie Avena M.D.   On: 12/16/2021 12:32    Assessment/Plan: Postpartum preeclampsia with severe range BP and pulmonary edema. She will receive IV labetalol Prn and Lasix for edema, ICU to monitor pulmonary status. Magnesium sulfate 2 g / hr for 24 hr. Lactation consult requested.  Christine Brewer 12/16/2021

## 2021-12-16 NOTE — Progress Notes (Addendum)
Pharmacy Antibiotic Note  Christine Brewer is a 20 y.o. female admitted on 12/16/2021 with sepsis.  Pharmacy has been consulted for aztreonam and vancomycin dosing.  Presenting with hypoxia, SOB, and fever 6 days post-partum. WBC 18, temp 101.6, Scr 0.63 (CrCl >100 mL/min). Upon chart review, [facility] listing allergy has hives for cefdinir, nausea/rash for PCN - but no evidence of PCN/cephalosporin use.    Plan: Vanc 1750 mg IV once then 1250 mg IV every 12 hours (estAUC 471) Aztreonam 2g IV every 8 hours Monitor cx results, clinical pic, and vanc levels prn  Addendum: Adjusted vancomycin regimen to 2000mg  once then 1250 q12h to accommodate for medication availability at [facility]  [facility], PharmD, BCPS 12/16/2021 2:22 PM ED Clinical Pharmacist -  [phone]  Height: 5\' 4"  (162.6 cm) Weight: 86.2 kg (190 lb) IBW/kg (Calculated) : 54.7  Temp (24hrs), Avg:101.6 F (38.7 C), Min:101.6 F (38.7 C), Max:101.6 F (38.7 C)  Recent Labs  Lab 12/16/21 1203  WBC 18.0*  CREATININE 0.63    Estimated Creatinine Clearance: 120.2 mL/min (by C-G formula based on SCr of 0.63 mg/dL).    Allergies  Allergen Reactions   Azithromycin Nausea Only, Rash and Other (See Comments)   Penicillins Nausea Only, Rash and Other (See Comments)   Cefdinir Other (See Comments)   Gluten Meal     Antimicrobials this admission: Vancomycin 12/26 >>  Aztreonam 12/26 >>  Metronidazole 12/26 >>   Dose adjustments this admission: N/A  Microbiology results: 12/26 BCx: sent 12/26 UCx: sent  12/26 COVID/Flu PCR: neg   Thank you for allowing pharmacy to be a part of this patients care.  1/27, PharmD, BCCCP Clinical Pharmacist  Phone: [phone] 12/16/2021 1:38 PM  Please check AMION for all [facility] Pharmacy phone numbers After 10:00 PM, call Main Pharmacy [phone]

## 2021-12-16 NOTE — ED Notes (Signed)
Patient transported to CT 

## 2021-12-16 NOTE — ED Notes (Signed)
Patient placed on 6 L simple mask per O2 sat of 88% on 4 L [place]. MD and PA made aware; RN at bedside.

## 2021-12-16 NOTE — ED Notes (Signed)
Report given to [facility]. 

## 2021-12-16 NOTE — Progress Notes (Signed)
Pharmacy Electrolyte Replacement  Recent Labs:  Recent Labs    12/16/21 1203  K 3.7  CREATININE 0.63    Low Critical Values (K </= 2.5, Phos </= 1, Mg </= 1) Present: None  MD Contacted: n/a  Plan: KCl 40 meq po x1  Repeat labs ordered.  Link Snuffer, PharmD, BCPS, BCCCP Clinical Pharmacist Please refer to [facility] for [facility] Pharmacy numbers 12/16/2021, 9:00 PM

## 2021-12-16 NOTE — Sepsis Progress Note (Signed)
Code Sepsis protocol being monitored by [facility]. 

## 2021-12-16 NOTE — ED Provider Notes (Signed)
[facility] EMERGENCY DEPT Provider Note   CSN: [id number] Arrival date & time: 12/16/21  1125     History Chief Complaint  Patient presents with   Shortness of Breath    Christine Brewer is a 20 y.o. female 6 days postpartum who presents for hypoxia, shortness of breath and fever.  Patient had vaginal delivery without any complications, although she was diagnosed with preeclampsia during her pregnancy.  She was diagnosed with COVID 2 weeks prior.  She states her current symptoms started 2 days ago and has been worsening since onset.  Her cough is since worsened and she reports bright red blood in her phlegm.  She also notes bilateral swelling of the legs that is worsening since she delivered.  She has been having trouble walking both due to the shortness of breath, but also because her feet are causing her so much pain.  She has been taking labetalol for her preeclampsia and has taken some Tylenol for her current symptoms without any relief.  When she arrived to the ED, her oxygen was around 89 to 90% on room air.  She denies abdominal pain, nausea, vomiting, diarrhea, sore throat.  She denies any urinary symptoms.   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  Social History   Tobacco Use   Smoking  status: Never   Smokeless tobacco: Never  Vaping Use   Vaping Use: Never used  Substance Use Topics   Alcohol use: Never   Drug use: Never    Home Medications Prior to Admission medications   Medication Sig Start Date End Date Taking? Authorizing Provider  FLUoxetine (PROZAC) 10 MG capsule Take 1 capsule (10 mg total) by mouth daily. 02/26/21   Rankin, Shuvon B, NP  Multiple Vitamins-Minerals (MULTIVITAMIN WITH MINERALS) tablet Take 1 tablet by mouth daily.    [provider]    Allergies    Azithromycin, Penicillins, Cefdinir, and Gluten meal  Review of Systems   Review of Systems  Constitutional:  Positive for appetite change, chills, fatigue and fever.  HENT:  Negative for sore throat.   Eyes:  Negative for redness and visual disturbance.  Respiratory:  Positive for cough and shortness of breath. Negative for chest tightness.   Cardiovascular:  Positive for leg swelling. Negative for chest pain.  Gastrointestinal:  Negative for abdominal pain, diarrhea and vomiting.  Endocrine: Negative.   Genitourinary: Negative.   Musculoskeletal:  Positive for myalgias.  Skin: Negative.   Neurological: Negative.   Psychiatric/Behavioral: Negative.    All other systems reviewed and are negative.  Physical Exam Updated Vital Signs BP 138/89 (BP Location: Left Arm)    Pulse (!) 105    Temp (!) 101.6 F (38.7 C)    Resp (!) 26    Ht 5\' 4"  (1.626 m)    Wt 86.2 kg    LMP 03/10/2021    SpO2 91%    BMI 32.61 kg/m  Today's Vitals   12/18/21 1000 12/18/21 1100 12/18/21 1200 12/18/21 1300  BP: 129/80 132/84 129/81 131/80  Pulse: 81 87 72 80  Resp: (!) 21 (!) 23 18 (!) 23  Temp:      TempSrc:      SpO2: 96% 95% 93% 95%  Weight:      Height:      PainSc:   Asleep    Body mass index is 32.43 kg/m.   Physical Exam Vitals and nursing note reviewed.  Constitutional:      General: She is not in acute distress.    Appearance: She is ill-appearing.  HENT:     Head: Atraumatic.      Nose: Nose normal.     Mouth/Throat:     Mouth: Mucous membranes are dry.     Pharynx: Oropharynx is clear.  Eyes:     Extraocular Movements: Extraocular movements intact.     Conjunctiva/sclera: Conjunctivae normal.  Cardiovascular:     Rate and Rhythm: Regular rhythm. Tachycardia present.     Pulses:          Radial pulses are 2+ on the right side and 2+ on the left side.       Dorsalis pedis pulses are detected w/ Doppler on the right side and detected w/ Doppler on the left side.     Heart sounds: No murmur heard.    Comments: Bilateral legs with 3+ nonpitting edema.  Pulses detected with Doppler due to swelling. Pulmonary:     Effort: Tachypnea, accessory muscle usage and respiratory distress present.     Breath sounds: Rhonchi and rales present.     Comments: Patient has diffuse rhonchi and rales in all lung fields.  Increased work of breathing with accessory muscle use.  Patient is in acute respiratory distress.  Hypoxic at 89% upon arrival. Abdominal:     General: Abdomen is flat. There is no distension.     Palpations: Abdomen is soft.     Tenderness: There is no abdominal tenderness.     Comments: Abdomen is soft nondistended, nontender to palpation  Musculoskeletal:        General: Normal range of motion.     Cervical back: Normal range of motion.  Skin:    General: Skin is dry.     Capillary Refill: Capillary refill takes less than 2 seconds.     Comments: Skin is hot to touch  Neurological:     General: No focal deficit present.     Mental Status: She is alert.  Psychiatric:        Mood and Affect: Mood normal.    ED Results / Procedures / Treatments   Labs (all labs ordered are listed, but only abnormal results are displayed) Labs Reviewed  COMPREHENSIVE METABOLIC PANEL - Abnormal; Notable for the following components:      Result Value   CO2 21 (*)    Calcium 8.3 (*)    Total Protein 6.3 (*)    All other  components within normal limits  CBC WITH  DIFFERENTIAL/PLATELET - Abnormal; Notable for the following components:   WBC 18.0 (*)    RBC 3.68 (*)    Hemoglobin 9.7 (*)    HCT 30.4 (*)    RDW 15.6 (*)    Neutro Abs 16.2 (*)    Abs Immature Granulocytes 0.09 (*)    All other components within normal limits  RESP PANEL BY RT-PCR (FLU A&B, COVID) ARPGX2  CULTURE, BLOOD (ROUTINE X 2)  CULTURE, BLOOD (ROUTINE X 2)  URINE CULTURE  PROTIME-INR  LACTIC ACID, PLASMA  LACTIC ACID, PLASMA  URINALYSIS, ROUTINE W REFLEX MICROSCOPIC  D-DIMER, QUANTITATIVE  APTT  BRAIN NATRIURETIC PEPTIDE    EKG None  Radiology DG Chest Portable 1 View  Result Date: 12/16/2021 CLINICAL DATA:  Fever, shortness of breath, hemoptysis EXAM: PORTABLE CHEST 1 VIEW COMPARISON:  Images of previous study done on 04/26/2020 are not available for review. Report for the previous study is available for review. FINDINGS: Cardiac size is within normal limits. Extensive patchy alveolar densities seen in both lungs, more so on the right side. Right lateral costophrenic angle is indistinct. There is no pneumothorax. IMPRESSION: Extensive patchy alveolar infiltrates are seen in both lungs suggesting extensive bilateral pneumonia. Possible small right pleural effusion. Electronically Signed   By: Ernie Avena M.D.   On: 12/16/2021 12:32    Procedures .Critical Care Performed by: Janell Quiet, PA-C Authorized by: Janell Quiet, PA-C   Critical care provider statement:    Critical care time (minutes):  30   Critical care start time:  12/16/2021 12:15 PM   Critical care end time:  12/16/2021 1:00 PM   Critical care was necessary to treat or prevent imminent or life-threatening deterioration of the following conditions:  Respiratory failure, sepsis, shock and dehydration   Critical care was time spent personally by me on the following activities:  Development of treatment plan with patient or surrogate, discussions with consultants, evaluation of patient's  response to treatment, examination of patient, ordering and review of laboratory studies, ordering and review of radiographic studies, ordering and performing treatments and interventions, pulse oximetry, re-evaluation of patient's condition, review of old charts and obtaining history from patient or surrogate   I assumed direction of critical care for this patient from another provider in my specialty: no     Care discussed with: admitting provider     Medications Ordered in ED Medications  lactated ringers infusion (has no administration in time range)  lactated ringers bolus 1,000 mL (has no administration in time range)    And  lactated ringers bolus 1,000 mL (has no administration in time range)    And  lactated ringers bolus 1,000 mL (has no administration in time range)  aztreonam (AZACTAM) 2 g in sodium chloride 0.9 % 100 mL IVPB (has no administration in time range)  metroNIDAZOLE (FLAGYL) IVPB 500 mg (has no administration in time range)  vancomycin (VANCOCIN) 1,750 mg in sodium chloride 0.9 % 500 mL IVPB (has no administration in time range)  acetaminophen (TYLENOL) tablet 1,000 mg (has no administration in time range)  albuterol (PROVENTIL) (2.5 MG/3ML) 0.083% nebulizer solution 5 mg (5 mg Nebulization Given 12/16/21 1257)    ED Course  I have reviewed the triage vital signs and the nursing notes.  Pertinent labs & imaging results that were available during my care of the patient were reviewed by me and considered in my medical decision making (see chart for details).  Clinical  Course as of 12/18/21 1341  Mon Dec 16, 2021  1820 Oxygen sats decreased to 88%.  O2 increased to 6 L.  Spoke with [facility] Hyacinth Meeker at [facility] who agrees to ED to ED transport to get patient over there quicker. [EC]    Clinical Course User Index [EC] Janell Quiet, PA-C   MDM Rules/Calculators/A&P                         This patient presents to the ED for concern of hypoxia and shortness of breath in  the setting of 6 days postpartum, this involves an extensive number of treatment options, and is a complaint that carries with it a high risk of complications and morbidity.  The differential diagnosis includes The emergent differential diagnosis for shortness of breath includes, but is not limited to, Pulmonary edema, bronchoconstriction, Pneumonia, Pulmonary embolism, Pneumotherax/ Hemothorax, Dysrythmia, ACS.   -Patient was hypoxic upon arrival in acute respiratory distress.  Sepsis protocol was initiated upon evaluation.  Fluid bolus was ordered, but held while waiting for BMP results to return out of concern for patient's bilateral lower extremity swelling.  Additional history obtained:  Additional history obtained from father at bedside External records from outside source obtained and reviewed including discharge summary from recent OB/GYN admission on 12/10/2021 with her vaginal delivery   Lab Tests:  I Ordered, reviewed, and interpreted labs.  The pertinent results include: Negative respiratory panel, CMP unremarkable, albumin low at 2.5, phosphorus elevated, BNP elevated 436 which is concerning for heart damage secondary to pulmonary edema, troponion mildly elevated without tredning upwards. Inflammatory marker CRP elevated at 25.3; CBC with elevated WBC at 18 with neutrophilia as well anemia. Her dimer was also elevated warranting CTA imaging; UA was not performed due to turbidity in patients urine   Imaging Studies ordered:  I ordered imaging studies including chest x-ray, CTA for pulmonary embolism I independently visualized and interpreted imaging which showed extensive patchy alveolar infiltrate suggesting extensive bilateral pneumonia with a small right pleural effusion.  CTA without evidence of pulmonary embolism.  Multifocal pneumonia further characterized with small bilateral pleural effusions that is worse on the right side. I agree with the radiologist  interpretation   Cardiac Monitoring:  The patient was maintained on a cardiac monitor.  I personally viewed and interpreted the cardiac monitored which showed an underlying rhythm of: Sinus tachycardia   Medicines ordered and prescription drug management:  Given patient's concerning respiratory status upon arrival initial labs, sepsis protocol was immediately initiated.  I ordered medication including Azactam, Flagyl and vancomycin IV given patient's penicillin and cephalosporin allergy for suspected sepsis.  Additionally she was given Tylenol 1 g to help with fever.  Fluid bolus was withheld initially while waiting for labs given concern for patient's lower extremity edema and respiratory distress as we do not want a worsen patient symptoms by overloading with fluids. Reevaluation of the patient after these medicines showed that the patient  fever had improved, however her respiratory status continues to decline.  I have reviewed the patients home medicines and have made adjustments as needed   Critical Interventions:  Critical interventions as noted above.   Consultations Obtained: All labs and imaging findings were discussed as well as pertinent plan with all consulted services. I requested consultation with Dr. Ophelia Charter of the hospital team who requested a consult with OB/GYN for admission to hospital.  On-call OB/GYN service recommends patient's start on 4 g bolus of magnesium followed  by 2 g an hour magnesium infusion.  This was started, however, the particular magnesium needed was not available here at [facility].  Pharmacist was able to amend order in order to give two 2 g boluses of magnesium, however we were unable to have her on continuous drip afterwards.  OB/GYN advised admission to hospital team with consult on their service. After consult with OB/GYN and discussion of recommendations with Dr. Lorin Mercy, she feels more comfortable with patient being admitted to the ICU team. I spoke  with Gerald Leitz, NP who also agrees that patient should be admitted to the ICU team.  Transfer was initiated, however patient was still waiting for bed when her respiratory status seemed to decline.  She was switched over to a nonrebreather on 6 L and maintaining oxygen sat 90% O2.  I called Dr. Noemi Chapel at the [facility] ED to initiate ED to ED transport so that patient could be at the proper facility for when she was able to be transferred to an ICU bed.  He agrees to accept patient. Care were   Reevaluation:  After the interventions noted above, I reevaluated the patient and found that they have :stayed the same   Dispostion:  After consideration of the diagnostic results and the patients response to treatment feel that the patent would benefit from Urgent ED to ED transfer with ICU admission. 1) pulmonary edema secondary to preeclampsia-4 g magnesium bolus with respiratory care management.  Triple IV antibiotic therapy initiated.  Patient transfer urgently over to [facility] ED for ICU admission via medical transport.      Final Clinical Impression(s) / ED Diagnoses Final diagnoses:  Pulmonary edema  Hypoxia    Rx / DC Orders ED Discharge Orders     None        Tonye Pearson, PA-C 12/18/21 1406    Blanchie Dessert, MD 12/19/21 1348

## 2021-12-16 NOTE — ED Notes (Signed)
Pt repositioned to more to a more upright position. Pt describes discomfort related to breathing. PA MD made aware.

## 2021-12-16 NOTE — ED Notes (Addendum)
2 bags of mag sulfate have been both scanned and given.  Narrator shows both bags scanned and given.

## 2021-12-16 NOTE — ED Notes (Signed)
Consulted with Pharm and PA before Admin of meds. Due to fact that Pt has had bilateral IV's BP cuff has been placed on wrist. Before admin of Mag, a manuel BP was taken and entered 142/90 systolic. Second BP taken on right upper arm BP 154/88. Will stay with Pt while admin of mag and BP cuff in correct position.

## 2021-12-16 NOTE — Progress Notes (Signed)
Plan of Care Note for accepted transfer   Patient: Christine Brewer MRN: [id number] DOB: 2001-06-01  Received a phone call from Facility: [facility], regarding transfer of Ms. Christine Brewer. Requesting MD: Delfino Lovett Patient with h/o depression, recent vaginal delivery (12/21) with preeclampsia presenting with hematemesis and SOB.  She had COVID 2 weeks before delivery.  Likely needs admission to SDU, per PA.  89% on RA on arrival, now on 3L.  BNP elevated, looks like COVID.  PE study negative.  Marked edema on exam.  Given 1L IVF.   COVID negative.   I am concerned about this being pulmonary edema from pre-E.  I have recommended OB consultation and patient may require PCCM admission; despite relatively low O2 requirement, she is at high risk of decompensation.   Plan of care: Hold transfer for now pending input from [facility] and/or PCCM.  OB consulted and agreed that this is likely pre-E.  She has been started on a magnesium drip.  I have asked that PCCM be consulted to see if ICU admission is appropriate at this time.  I called and spoke with Christine Brewer and she agrees that this patient appears to be more appropriate for ICU level of care.  Admission is deferred by [facility] at this time.      Plan of care: The patient will be accepted for admission to ICU unit, at [facility] under the care of PCCM.    Author: Jonah Blue, M.D. 12/16/2021  For on call review [email or web address].

## 2021-12-16 NOTE — Progress Notes (Signed)
eLink Physician-Brief Progress Note Patient Name: Shantia Sanford DOB: 02/22/2001 MRN: [id number]   Date of Service  12/16/2021  HPI/Events of Note  20 year old woman with acute respiratory failure, PNA and pulmonary edema.   eICU Interventions  Bedside CCM team assessing patient at this time Call E link if needed     Intervention Category Major Interventions: Respiratory failure - evaluation and management Evaluation Type: New Patient Evaluation  Oretha Milch 12/16/2021, 7:51 PM

## 2021-12-16 NOTE — ED Notes (Signed)
Pt at 89% good pleth when sleeping. Increased to 3L. RT notified

## 2021-12-16 NOTE — H&P (Addendum)
NAME:  Christine Brewer, MRN:  [id number], DOB:  Oct 26, 2001, LOS: 0 ADMISSION DATE:  12/16/2021, CONSULTATION DATE:  12/26 REFERRING MD:  Dr. Iline Oven EDP, CHIEF COMPLAINT:   SOB  History of Present Illness:  20 year old female recently delivered on 12/20 at [redacted]w[redacted]d. G1P1. Diagnosed with gestational hypertension during the 3rd trimester treated with labetalol. Admission for delivery was uncomplicated. Discharged 12/22. Since the time of discharge she complained of worsening shortness of breath, hemoptysis, and lower extremity edema. She has continued to take labetalol as prescribed. She presented to [facility] for these complaints and was immediately found to have oxygen saturation 89% on room air. Imaging raised concern for pnemonia and she was given fluid bolus. OB was consulted and recommended magnesium bolus and infusion. Mag infusion not available at [facility] and she was urgently transferred to [facility]. PCCM asked to admit.   Pertinent  Medical History   has a past medical history of Depression and Pre-eclampsia.   Significant Hospital Events: Including procedures, antibiotic start and stop dates in addition to other pertinent events   12/26 admit  Interim History / Subjective:    Objective   Blood pressure (!) 156/97, pulse (!) 112, temperature 99.6 F (37.6 C), temperature source Oral, resp. rate (!) 35, height 5\' 4"  (1.626 m), weight 86.2 kg, last menstrual period 03/10/2021, SpO2 95 %.        Intake/Output Summary (Last 24 hours) at 12/16/2021 2015 Last data filed at 12/16/2021 1809 Gross per 24 hour  Intake 1363.26 ml  Output --  Net 1363.26 ml   Filed Weights   12/16/21 1159  Weight: 86.2 kg    Examination: General: young adult female of normal body habitus HENT: [place]/AT, PERRL, no JVD Lungs: Significant distress. Resp 45/min. Coarse crackles throughout Cardiovascular: tachy, regular, no MRG Abdomen: Soft, non-tender, non-distended Extremities:  +2 pitting edema to bilateral lower extremities.  Neuro: Alert, oriented, non-focal. Hyperreflexic.   CT reviewed by me: bilateral patchy consolidations  Resolved Hospital Problem list     Assessment & Plan:   Pre-eclampsia: concern for postpartum cardiomyopathy, but bedside echo is reassuring.  Hypervolemia Pulmonary edema - Lasix 40 mg now - Magnesium 4 gram bolus given in ED, starting infusion - OB has been consulted and will see in the ICU - SBP goal < 140/90: PRN labetalol - Continue home labetalol 100mg  BID - Formal echo pending - Place foley for I&O and retention.  - F/u chemistry/mag. Repeat in AM - Continue aztreonam/vanco. PCT pending. R/o PNA. Febrile and wbc 18 with abnormal CXR.  - Cultures pending  Postpartum day 6 - OB rapid has been called and is providing pumping supplies - OB to see.    Best Practice (right click and "Reselect all SmartList Selections" daily)   Diet/type: NPO w/ oral meds DVT prophylaxis: LMWH GI prophylaxis: N/A Lines: N/A Foley:  Yes, and it is still needed Code Status:  full code Last date of multidisciplinary goals of care discussion [ ]   Labs   CBC: Recent Labs  Lab 12/16/21 1203  WBC 18.0*  NEUTROABS 16.2*  HGB 9.7*  HCT 30.4*  MCV 82.6  PLT 123456    Basic Metabolic Panel: Recent Labs  Lab 12/16/21 1203  NA 139  K 3.7  CL 108  CO2 21*  GLUCOSE 83  BUN 13  CREATININE 0.63  CALCIUM 8.3*   GFR: Estimated Creatinine Clearance: 120.2 mL/min (by C-G formula based on SCr of 0.63 mg/dL). Recent Labs  Lab  12/16/21 1203  WBC 18.0*  LATICACIDVEN 0.8    Liver Function Tests: Recent Labs  Lab 12/16/21 1203  AST 19  ALT 15  ALKPHOS 91  BILITOT 0.6  PROT 6.3*  ALBUMIN 3.5   No results for input(s): LIPASE, AMYLASE in the last 168 hours. No results for input(s): AMMONIA in the last 168 hours.  ABG No results found for: PHART, PCO2ART, PO2ART, HCO3, TCO2, ACIDBASEDEF, O2SAT   Coagulation Profile: Recent  Labs  Lab 12/16/21 1203  INR 1.0    Cardiac Enzymes: No results for input(s): CKTOTAL, CKMB, CKMBINDEX, TROPONINI in the last 168 hours.  HbA1C: No results found for: HGBA1C  CBG: Recent Labs  Lab 12/16/21 1955  GLUCAP 82    Review of Systems:   Limited due to work of breathing.  Bolds are positive  Constitutional: weight loss, gain, night sweats, Fevers, chills, fatigue .  HEENT: headaches, Sore throat, sneezing, nasal congestion, post nasal drip, Difficulty swallowing, Tooth/dental problems, visual complaints visual changes, ear ache CV:  chest pain, radiates to back:,Orthopnea, PND, swelling in lower extremities**, dizziness, palpitations, syncope.  GI  heartburn, indigestion, abdominal pain, nausea, vomiting, diarrhea, change in bowel habits, loss of appetite, bloody stools.  Resp: cough, productive: , hemoptysis, dyspnea, chest pain, pleuritic.  Skin: rash or itching or icterus GU: dysuria, change in color of urine, urgency or frequency. flank pain, hematuria  MS: joint pain or swelling. decreased range of motion  Psych: change in mood or affect. depression or anxiety.  Neuro: difficulty with speech, weakness, numbness, ataxia    Past Medical History:  She,  has a past medical history of Depression and Pre-eclampsia.   Surgical History:   Past Surgical History:  Procedure Laterality Date   NO PAST SURGERIES       Social History:   reports that she has never smoked. She has never used smokeless tobacco. She reports that she does not drink alcohol and does not use drugs.   Family History:  Her family history is not on file.   Allergies Allergies  Allergen Reactions   Azithromycin Nausea Only, Rash and Other (See Comments)   Penicillins Nausea Only, Rash and Other (See Comments)   Cefdinir Other (See Comments)   Gluten Meal      Home Medications  Prior to Admission medications   Medication Sig Start Date End Date Taking? Authorizing Provider  FLUoxetine  (PROZAC) 10 MG capsule Take 1 capsule (10 mg total) by mouth daily. 02/26/21   Rankin, Shuvon B, NP  Multiple Vitamins-Minerals (MULTIVITAMIN WITH MINERALS) tablet Take 1 tablet by mouth daily.    [provider]     Critical care time: 42 minutes.      Joneen Roach, AGACNP-BC [facility]  See Amion for personal pager PCCM on call pager [phone] until 7pm. Please call [facility] 7p-7a. [phone]  12/16/2021 8:52 PM

## 2021-12-16 NOTE — Progress Notes (Signed)
Pt placed on bipap but then removed d/t intolerance.  RN and MD aware.  Bipap remains at bedside on standby.

## 2021-12-16 NOTE — ED Notes (Signed)
MD asked fluids be held until BNP blood work is completed

## 2021-12-16 NOTE — ED Notes (Signed)
RT at bed side. Pt was at 88% and moved to 4 L. Pt continues at same rate. Pt increased to 6L on mask. Currently at 96% Resp much improved down to 25 range.

## 2021-12-16 NOTE — ED Notes (Signed)
First infusion done 138/90

## 2021-12-16 NOTE — ED Notes (Signed)
Pt's disposition much more calm after sitting upright. Pt states that changing the position made her feel better.

## 2021-12-17 ENCOUNTER — Encounter (HOSPITAL_COMMUNITY): Payer: Self-pay | Admitting: Pulmonary Disease

## 2021-12-17 ENCOUNTER — Inpatient Hospital Stay (HOSPITAL_COMMUNITY): Payer: BC Managed Care – PPO

## 2021-12-17 DIAGNOSIS — R0603 Acute respiratory distress: Secondary | ICD-10-CM

## 2021-12-17 LAB — MAGNESIUM
Magnesium: 3.3 mg/dL — ABNORMAL HIGH (ref 1.7–2.4)
Magnesium: 4 mg/dL — ABNORMAL HIGH (ref 1.7–2.4)
Magnesium: 5 mg/dL — ABNORMAL HIGH (ref 1.7–2.4)

## 2021-12-17 LAB — COMPREHENSIVE METABOLIC PANEL
ALT: 16 U/L (ref 0–44)
ALT: 18 U/L (ref 0–44)
AST: 16 U/L (ref 15–41)
AST: 22 U/L (ref 15–41)
Albumin: 2.5 g/dL — ABNORMAL LOW (ref 3.5–5.0)
Albumin: 2.5 g/dL — ABNORMAL LOW (ref 3.5–5.0)
Alkaline Phosphatase: 83 U/L (ref 38–126)
Alkaline Phosphatase: 88 U/L (ref 38–126)
Anion gap: 9 (ref 5–15)
Anion gap: 9 (ref 5–15)
BUN: 8 mg/dL (ref 6–20)
BUN: 9 mg/dL (ref 6–20)
CO2: 19 mmol/L — ABNORMAL LOW (ref 22–32)
CO2: 20 mmol/L — ABNORMAL LOW (ref 22–32)
Calcium: 7.1 mg/dL — ABNORMAL LOW (ref 8.9–10.3)
Calcium: 7.8 mg/dL — ABNORMAL LOW (ref 8.9–10.3)
Chloride: 108 mmol/L (ref 98–111)
Chloride: 108 mmol/L (ref 98–111)
Creatinine, Ser: 0.72 mg/dL (ref 0.44–1.00)
Creatinine, Ser: 0.72 mg/dL (ref 0.44–1.00)
GFR, Estimated: >60 mL/min (ref >60)
GFR, Estimated: >60 mL/min (ref >60)
Glucose, Bld: 139 mg/dL — ABNORMAL HIGH (ref 70–99)
Glucose, Bld: 98 mg/dL (ref 70–99)
Potassium: 3.3 mmol/L — ABNORMAL LOW (ref 3.5–5.1)
Potassium: 3.8 mmol/L (ref 3.5–5.1)
Sodium: 136 mmol/L (ref 135–145)
Sodium: 137 mmol/L (ref 135–145)
Total Bilirubin: 0.7 mg/dL (ref 0.3–1.2)
Total Bilirubin: 0.8 mg/dL (ref 0.3–1.2)
Total Protein: 5.6 g/dL — ABNORMAL LOW (ref 6.5–8.1)
Total Protein: 6 g/dL — ABNORMAL LOW (ref 6.5–8.1)

## 2021-12-17 LAB — MRSA NEXT GEN BY PCR, NASAL: MRSA by PCR Next Gen: NOT DETECTED

## 2021-12-17 LAB — PHOSPHORUS
Phosphorus: 3.7 mg/dL (ref 2.5–4.6)
Phosphorus: 4.5 mg/dL (ref 2.5–4.6)

## 2021-12-17 LAB — CBC WITH DIFFERENTIAL/PLATELET
Abs Immature Granulocytes: 0.09 10*3/uL — ABNORMAL HIGH (ref 0.00–0.07)
Basophils Absolute: 0 10*3/uL (ref 0.0–0.1)
Basophils Relative: 0 %
Eosinophils Absolute: 0.1 10*3/uL (ref 0.0–0.5)
Eosinophils Relative: 1 %
HCT: 29.5 % — ABNORMAL LOW (ref 36.0–46.0)
Hemoglobin: 9.5 g/dL — ABNORMAL LOW (ref 12.0–15.0)
Immature Granulocytes: 1 %
Lymphocytes Relative: 17 %
Lymphs Abs: 2.3 10*3/uL (ref 0.7–4.0)
MCH: 26.5 pg (ref 26.0–34.0)
MCHC: 32.2 g/dL (ref 30.0–36.0)
MCV: 82.4 fL (ref 80.0–100.0)
Monocytes Absolute: 0.5 10*3/uL (ref 0.1–1.0)
Monocytes Relative: 4 %
Neutro Abs: 11 10*3/uL — ABNORMAL HIGH (ref 1.7–7.7)
Neutrophils Relative %: 77 %
Platelets: 374 10*3/uL (ref 150–400)
RBC: 3.58 MIL/uL — ABNORMAL LOW (ref 3.87–5.11)
RDW: 15.9 % — ABNORMAL HIGH (ref 11.5–15.5)
WBC: 14.1 10*3/uL — ABNORMAL HIGH (ref 4.0–10.5)
nRBC: 0 % (ref 0.0–0.2)

## 2021-12-17 LAB — URINALYSIS, ROUTINE W REFLEX MICROSCOPIC
Bilirubin Urine: NEGATIVE
Glucose, UA: NEGATIVE mg/dL
Hgb urine dipstick: NEGATIVE
Ketones, ur: NEGATIVE mg/dL
Leukocytes,Ua: NEGATIVE
Nitrite: NEGATIVE
Protein, ur: NEGATIVE mg/dL
Specific Gravity, Urine: 1.006 (ref 1.005–1.030)
pH: 5 (ref 5.0–8.0)

## 2021-12-17 LAB — HIV ANTIBODY (ROUTINE TESTING W REFLEX): HIV Screen 4th Generation wRfx: NONREACTIVE

## 2021-12-17 LAB — SEDIMENTATION RATE: Sed Rate: 52 mm/hr — ABNORMAL HIGH (ref 0–22)

## 2021-12-17 LAB — URINE CULTURE

## 2021-12-17 LAB — ECHOCARDIOGRAM COMPLETE
Height: 64 in
S' Lateral: 3 cm
Weight: 3040 oz

## 2021-12-17 LAB — PROCALCITONIN
Procalcitonin: 0.6 ng/mL
Procalcitonin: 0.7 ng/mL

## 2021-12-17 LAB — TROPONIN I (HIGH SENSITIVITY)
Troponin I (High Sensitivity): 26 ng/L — ABNORMAL HIGH (ref <18)
Troponin I (High Sensitivity): 30 ng/L — ABNORMAL HIGH (ref <18)

## 2021-12-17 LAB — C-REACTIVE PROTEIN: CRP: 25.3 mg/dL — ABNORMAL HIGH (ref <1.0)

## 2021-12-17 LAB — LACTIC ACID, PLASMA: Lactic Acid, Venous: 1.9 mmol/L (ref 0.5–1.9)

## 2021-12-17 MED ORDER — FUROSEMIDE 10 MG/ML IJ SOLN
40.0000 mg | Freq: Three times a day (TID) | INTRAMUSCULAR | Status: AC
  Administered 2021-12-17 – 2021-12-18 (×3): 40 mg via INTRAVENOUS
  Filled 2021-12-17 (×3): qty 4

## 2021-12-17 MED ORDER — POTASSIUM CHLORIDE 10 MEQ/100ML IV SOLN
10.0000 meq | INTRAVENOUS | Status: DC
  Filled 2021-12-17: qty 100

## 2021-12-17 MED ORDER — CHLORHEXIDINE GLUCONATE CLOTH 2 % EX PADS
6.0000 | MEDICATED_PAD | Freq: Every day | CUTANEOUS | Status: DC
  Administered 2021-12-17 – 2021-12-18 (×2): 6 via TOPICAL

## 2021-12-17 MED ORDER — POTASSIUM CHLORIDE CRYS ER 20 MEQ PO TBCR
20.0000 meq | EXTENDED_RELEASE_TABLET | Freq: Once | ORAL | Status: AC
  Administered 2021-12-17: 15:00:00 20 meq via ORAL
  Filled 2021-12-17: qty 1

## 2021-12-17 MED ORDER — VANCOMYCIN HCL 1250 MG/250ML IV SOLN
1250.0000 mg | Freq: Two times a day (BID) | INTRAVENOUS | Status: DC
Start: 2021-12-17 — End: 2021-12-18
  Administered 2021-12-17 – 2021-12-18 (×3): 1250 mg via INTRAVENOUS
  Filled 2021-12-17 (×3): qty 250

## 2021-12-17 MED ORDER — POTASSIUM CHLORIDE CRYS ER 20 MEQ PO TBCR
20.0000 meq | EXTENDED_RELEASE_TABLET | ORAL | Status: DC
  Administered 2021-12-17: 05:00:00 20 meq via ORAL
  Filled 2021-12-17: qty 1

## 2021-12-17 MED ORDER — FUROSEMIDE 10 MG/ML IJ SOLN
40.0000 mg | Freq: Once | INTRAMUSCULAR | Status: AC
  Administered 2021-12-17: 09:00:00 40 mg via INTRAVENOUS
  Filled 2021-12-17: qty 4

## 2021-12-17 MED ORDER — MAGNESIUM SULFATE 40 GM/1000ML IV SOLN
2.0000 g/h | INTRAVENOUS | Status: DC
  Administered 2021-12-17: 16:00:00 2 g/h via INTRAVENOUS
  Filled 2021-12-17 (×2): qty 1000

## 2021-12-17 MED ORDER — POTASSIUM CHLORIDE CRYS ER 20 MEQ PO TBCR
40.0000 meq | EXTENDED_RELEASE_TABLET | Freq: Once | ORAL | Status: AC
  Administered 2021-12-17: 09:00:00 40 meq via ORAL
  Filled 2021-12-17: qty 2

## 2021-12-17 MED ORDER — ORAL CARE MOUTH RINSE
15.0000 mL | Freq: Two times a day (BID) | OROMUCOSAL | Status: DC
  Administered 2021-12-17 (×2): 15 mL via OROMUCOSAL

## 2021-12-17 NOTE — Progress Notes (Signed)
RN assisted patient with breast milk pumping. Bilateral breasts pumped for 20 minutes. Right breast expressed about 63ml. Left breast expressed about 77ml. No tenderness upon palpation or with pumping. Breast milk discarded per patient request. Will continue to monitor and will assist patient with pumping again in about 3 hours.

## 2021-12-17 NOTE — Progress Notes (Signed)
[facility] ADULT ICU REPLACEMENT PROTOCOL   The patient does apply for the [facility] Adult ICU Electrolyte Replacment Protocol based on the criteria listed below:   1.Exclusion criteria: TCTS patients, ECMO patients, and Dialysis patients 2. Is GFR >/= 30 ml/min? Yes.    Patient's GFR today is >60 3. Is SCr </= 2? Yes.   Patient's SCr is 0.72 mg/dL 4. Did SCr increase >/= 0.5 in 24 hours? No. 5.Pt's weight >40kg  Yes.   6. Abnormal electrolyte(s): K+ 3.3  7. Electrolytes replaced per protocol 8.  Call MD STAT for K+ </= 2.5, Phos </= 1, or Mag </= 1 Physician:  n/a  Melvern Banker 12/17/2021 4:55 AM

## 2021-12-17 NOTE — Care Management (Signed)
°  Transition of Care [facility]) Screening Note   Patient Details  Name: Christine Brewer Date of Birth: 05-Nov-2001   Transition of Care [facility]) CM/SW Contact:    Lawerance Sabal, RN Phone Number: 12/17/2021, 2:34 PM    Transition of Care Department [facility]) has reviewed patient and no TOC needs have been identified at this time. We will continue to monitor patient advancement through interdisciplinary progression rounds. If new patient transition needs arise, please place a TOC consult.

## 2021-12-17 NOTE — Progress Notes (Signed)
PCCM intermittent progress note   Spoke with Dr. Adrian Blackwater with OB/ GYN.  Mag gtt stopped around noon due to developing paresthesias.     - will check stat mag level - ideally per OB, mag gtt should be continued for 24 hours to minimize seizure risk with ideal mag levels 4.8 to 8.4 - am mag level at 6am was 5 - should patient develop seizure- patient should be given Mag 6gm f/b 2gm/hr gtt.        Christine Brewer, ACNP [place] [facility] 12/17/2021, 2:18 PM  See Amion for pager If no response to pager, please call PCCM consult pager After 7:00 pm call Elink

## 2021-12-17 NOTE — Progress Notes (Signed)
NAME:  Christine Brewer, MRN:  [id number], DOB:  [birth year], LOS: 1 ADMISSION DATE:  12/16/2021, CONSULTATION DATE:  12/26 REFERRING MD:  Dr. Ouida Sills EDP, CHIEF COMPLAINT:   SOB  History of Present Illness:  20 year old female recently delivered on 12/20 at [redacted]w[redacted]d. G1P1. Diagnosed with gestational hypertension during the 3rd trimester treated with labetalol. Admission for delivery was uncomplicated. Discharged 12/22. Since the time of discharge she complained of worsening shortness of breath, hemoptysis, and lower extremity edema. She has continued to take labetalol as prescribed. She presented to [facility] for these complaints and was immediately found to have oxygen saturation 89% on room air. Imaging raised concern for pnemonia and she was given fluid bolus. OB was consulted and recommended magnesium bolus and infusion. Mag infusion not available at [facility] and she was urgently transferred to [facility]. PCCM asked to admit.   Pertinent  Medical History   has a past medical history of Depression and Pre-eclampsia.   Significant Hospital Events: Including procedures, antibiotic start and stop dates in addition to other pertinent events   12/26 admit  Interim History / Subjective:  Better SOB Remains on 6L [place] Tmax 100.5 No sputum production Remains on Mag gtt 2gm/hr  Objective   Blood pressure 128/87, pulse (!) 101, temperature 99.5 F (37.5 C), temperature source Oral, resp. rate (!) 33, height 5\' 4"  (1.626 m), weight 86.2 kg, last menstrual period 03/10/2021, SpO2 93 %.        Intake/Output Summary (Last 24 hours) at 12/17/2021 0747 Last data filed at 12/17/2021 0400 Gross per 24 hour  Intake 3054.11 ml  Output 2825 ml  Net 229.11 ml   Filed Weights   12/16/21 1159  Weight: 86.2 kg    Examination: General:  Young adult female lying in bed in NAD HEENT: MM pink/moist Neuro:  AOx3 CV: ST, no murmur PULM:  non labored, tachypneic in the 20's, few  crackles throughout GI: soft, bs+, NT, foley  Extremities: warm/dry, LE edema +1 Skin: no rashes OB: reflexes 2+in LE, no clonus  UOP 2.8 L plus 2 unmeasured urinary occurrences    Resolved Hospital Problem list     Assessment & Plan:   Pre-eclampsia: 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 09-06-1993 - continue supplemental O2 for sats > 92%- not able to wean thus far - continue vanc and aztreonam for now - PCT 0.6 > 0.7, tmax 100.5 overnight, and WBC improving 18> 14 - continue to follow cultures  - urine strep neg, urine legionella pending - resend UA as now has foley  - Mag infusion per OB, will reach out, ?stopping - Appreciate OB assistance  - SBP goal < 140/90: PRN labetalol - Continue home labetalol 100mg  BID and prn  - Formal echo pending - continue foley for now- strict I/Os - serial BMET   Hypokalemia  - additional KCL 40 meq now and 20 meq this afternoon given additional lasix  - serial BMET  Postpartum day 6 - per OB - lactation continues to follow with recs to continue pumping 2-3 hours    Best Practice (right click and "Reselect all SmartList Selections" daily)   Diet/type: NPO w/ oral meds DVT prophylaxis: LMWH GI prophylaxis: N/A Lines: N/A Foley:  Yes, and it is still needed Code Status:  full code Last date of multidisciplinary goals of care discussion; patient updated on plan of care  Labs   CBC: Recent Labs  Lab 12/16/21 1203 12/17/21 0600  WBC 18.0* 14.1*  NEUTROABS 16.2* 11.0*  HGB 9.7* 9.5*  HCT 30.4* 29.5*  MCV 82.6 82.4  PLT 398 374    Basic Metabolic Panel: Recent Labs  Lab 12/16/21 1203 12/16/21 2358 12/17/21 0600  NA 139 136 137  K 3.7 3.3* 3.8  CL 108 108 108  CO2 21* 19* 20*  GLUCOSE 83 139* 98  BUN 13 8 9    CREATININE 0.63 0.72 0.72  CALCIUM 8.3* 7.8* 7.1*  MG  --  3.3* 5.0*  PHOS  --  3.7 4.5   GFR: Estimated Creatinine Clearance: 120.2 mL/min (by C-G formula based on SCr of 0.72 mg/dL). Recent Labs  Lab 12/16/21 1203 12/16/21 2358 12/17/21 0600  PROCALCITON  --  0.60  --   WBC 18.0*  --  14.1*  LATICACIDVEN 0.8 1.9  --     Liver Function Tests: Recent Labs  Lab 12/16/21 1203 12/16/21 2358 12/17/21 0600  AST 19 22 16   ALT 15 18 16   ALKPHOS 91 83 88  BILITOT 0.6 0.7 0.8  PROT 6.3* 5.6* 6.0*  ALBUMIN 3.5 2.5* 2.5*   No results for input(s): LIPASE, AMYLASE in the last 168 hours. No results for input(s): AMMONIA in the last 168 hours.  ABG No results found for: PHART, PCO2ART, PO2ART, HCO3, TCO2, ACIDBASEDEF, O2SAT   Coagulation Profile: Recent Labs  Lab 12/16/21 1203  INR 1.0    Cardiac Enzymes: No results for input(s): CKTOTAL, CKMB, CKMBINDEX, TROPONINI in the last 168 hours.  HbA1C: No results found for: HGBA1C  CBG: Recent Labs  Lab 12/16/21 1955  GLUCAP 82     Critical care time: 30 minutes.        , ACNP [place] [facility] 12/17/2021, 7:47 AM  See Amion for pager If no response to pager, please call PCCM consult pager After 7:00 pm call Elink

## 2021-12-17 NOTE — Progress Notes (Signed)
RN assisted patient with breast milk pumping. Bilateral breasts pumped for 20 minutes. Right breast expressed about 65ml. Left breast expressed about 29ml. No tenderness upon palpation or with pumping. Breast milk discarded per patient request. Will continue to monitor and will assist patient with pumping again in about 3 hours.

## 2021-12-17 NOTE — Progress Notes (Signed)
°  Echocardiogram 2D Echocardiogram has been performed.  Delcie Roch 12/17/2021, 9:34 AM

## 2021-12-17 NOTE — Progress Notes (Signed)
Pumped bilateral breasts with assistance from RN. Right breast swollen with no tenderness. Expressed 2.5 oz. Will plan to pump again in about 2 hours.

## 2021-12-18 ENCOUNTER — Telehealth: Payer: Self-pay | Admitting: Pulmonary Disease

## 2021-12-18 ENCOUNTER — Inpatient Hospital Stay (HOSPITAL_COMMUNITY): Payer: BC Managed Care – PPO

## 2021-12-18 LAB — CBC
HCT: 32.5 % — ABNORMAL LOW (ref 36.0–46.0)
Hemoglobin: 10.6 g/dL — ABNORMAL LOW (ref 12.0–15.0)
MCH: 26.6 pg (ref 26.0–34.0)
MCHC: 32.6 g/dL (ref 30.0–36.0)
MCV: 81.7 fL (ref 80.0–100.0)
Platelets: 462 10*3/uL — ABNORMAL HIGH (ref 150–400)
RBC: 3.98 MIL/uL (ref 3.87–5.11)
RDW: 16 % — ABNORMAL HIGH (ref 11.5–15.5)
WBC: 10.5 10*3/uL (ref 4.0–10.5)
nRBC: 0 % (ref 0.0–0.2)

## 2021-12-18 LAB — ANCA TITERS
Atypical P-ANCA titer: <1:20 {titer}
C-ANCA: <1:20 {titer}
P-ANCA: <1:20 {titer}

## 2021-12-18 LAB — BASIC METABOLIC PANEL
Anion gap: 12 (ref 5–15)
BUN: 14 mg/dL (ref 6–20)
CO2: 23 mmol/L (ref 22–32)
Calcium: 8 mg/dL — ABNORMAL LOW (ref 8.9–10.3)
Chloride: 101 mmol/L (ref 98–111)
Creatinine, Ser: 0.76 mg/dL (ref 0.44–1.00)
GFR, Estimated: >60 mL/min (ref >60)
Glucose, Bld: 96 mg/dL (ref 70–99)
Potassium: 4.5 mmol/L (ref 3.5–5.1)
Sodium: 136 mmol/L (ref 135–145)

## 2021-12-18 LAB — LEGIONELLA PNEUMOPHILA SEROGP 1 UR AG: L. pneumophila Serogp 1 Ur Ag: NEGATIVE

## 2021-12-18 LAB — MAGNESIUM: Magnesium: 3.5 mg/dL — ABNORMAL HIGH (ref 1.7–2.4)

## 2021-12-18 LAB — ANA W/REFLEX IF POSITIVE: Anti Nuclear Antibody (ANA): NEGATIVE

## 2021-12-18 LAB — RHEUMATOID FACTOR: Rheumatoid fact SerPl-aCnc: 15.8 IU/mL — ABNORMAL HIGH (ref <14.0)

## 2021-12-18 LAB — PROCALCITONIN: Procalcitonin: 0.51 ng/mL

## 2021-12-18 LAB — PHOSPHORUS: Phosphorus: 5.5 mg/dL — ABNORMAL HIGH (ref 2.5–4.6)

## 2021-12-18 MED ORDER — POTASSIUM CHLORIDE CRYS ER 20 MEQ PO TBCR: 20.0000 meq | EXTENDED_RELEASE_TABLET | Freq: Every day | ORAL | Status: DC

## 2021-12-18 MED ORDER — FUROSEMIDE 20 MG PO TABS: 20.0000 mg | ORAL_TABLET | Freq: Every day | ORAL | 0 refills | Status: AC

## 2021-12-18 MED ORDER — POTASSIUM CHLORIDE CRYS ER 20 MEQ PO TBCR: 20.0000 meq | EXTENDED_RELEASE_TABLET | Freq: Every day | ORAL | 0 refills | Status: AC

## 2021-12-18 MED ORDER — LABETALOL HCL 100 MG PO TABS
200.0000 mg | ORAL_TABLET | Freq: Two times a day (BID) | ORAL | 0 refills | Status: AC
Start: 2021-12-18 — End: 2022-01-17

## 2021-12-18 MED ORDER — POTASSIUM CHLORIDE CRYS ER 20 MEQ PO TBCR: 20.0000 meq | EXTENDED_RELEASE_TABLET | Freq: Every day | ORAL | 0 refills | Status: DC

## 2021-12-18 NOTE — Progress Notes (Signed)
RN assisted patient with breast milk pumping. Lactation consultant at bedside. Bilateral breasts pumped for 30 minutes. Right breast expressed about 22ml. Left breast expressed about 61ml. No tenderness upon palpation or with pumping. Breast milk stored on ice per patient request.

## 2021-12-18 NOTE — Progress Notes (Signed)
OB note  Pt has done very well wioth diuresis, EF 60% No O2 required  Recommend: Labetalol 200 BID BID Lasix 20 mg BID x 7 days w/KDur 20 mEq daily while on the lasix Follow up with [facility] on Tuesday 12/24/20  Lazaro Arms, MD 12/18/2021 4:43 PM

## 2021-12-18 NOTE — Lactation Note (Addendum)
Lactation Consultation Note  Patient Name: Christine Brewer [id number] Date: 12/18/2021 Reason for consult: Follow-up assessment;Other (Comment) Age:20 y.o.  As I entered room, Mom was pumping. It was apparent that the size 24 flanges were too large for her. I provided her with size 21 flanges with good result & Mom reported that they felt "a lot better."   I asked Mom why she had been pumping & dumping. She said her sister had caused her to have concerns about the medications she was on. Per Christine Brewer & Christine Brewer's "Medications & Mother's Milk", there are no concerns about the vancomycin & aztreonam. Magnesium & labetalol are often used with postpartum breastfeeding mothers. Furosemide's concern is with decreased supply (see below). Christine Berthold, RN said the pharmacist had also reviewed Mom's meds and said that they were safe.  Mom is usually able to pump 8-9 oz/session when using her MomCozy pump (size 20 insert). Mom's volume has likely decreased during her inpatient stay b/c of the wrong flange size and larger doses of furosemide. Even though Mom has bags of stored EBM at home, her family members have been using formula.   Mom anticipates discharge tomorrow.   Christine Brewer Christine Brewer 12/18/2021, 2:57 PM

## 2021-12-18 NOTE — Telephone Encounter (Signed)
Please arrange follow up appointment for Ms. August in 6 weeks to be seen by Dr. Everardo All for hospital follow up.  Admit with pre-eclampsia, infiltrates and elevated autoimmune lab follow up.        Canary Brim, MSN, APRN, NP-C, AGACNP-BC [place] [facility] 12/18/2021, 3:39 PM   Please see [email or web address] for pager details.   From 7A-7P if no response, please call [phone] After hours, please call ELink [phone]

## 2021-12-18 NOTE — Progress Notes (Signed)
RN assisted patient with breast milk pumping. Bilateral breasts pumped for 30 minutes. Right breast expressed about 102ml. Left breast expressed about 51ml. No tenderness upon palpation or with pumping. Breast milk discarded per patient request. Will continue to monitor and will assist patient with pumping again in about 3 hours.

## 2021-12-18 NOTE — Discharge Summary (Addendum)
Physician Discharge Summary         Patient ID: Christine Brewer MRN: [id number] DOB/AGE: January 11, 2001 20 y.o.  Admit date: 12/16/2021 Discharge date: 12/18/2021  Discharge Diagnoses:    Post-partum pre-eclampsia  Pulmonary edema  Hypertension  Discharge summary    20 year old Caucasian female with prior medical history of depression and gestational hypertension during her third trimester on labetalol.  She had a uncomplicated spontaneous vaginal delivery on 12/10/2021 at [facility] at [redacted]w[redacted]d G1P1; she continued labetalol post-partum for hypertension.  [facility] (12/16/2021) with increased dyspnea, blood tinged sputum production, and lower extremity edema.    She was found to be hypertensive with highest reading of 165/ 115 (MAP 127), febrile at 101.2, and SpO2 89% on room air.  WBC noted at 18.  She was given an IV fluid bolus with clinical concern for pneumonia.  A CTA PE was completed which ruled out a PE but showed extensive patchy airspace consolidations bilaterally and small bilateral pleural effusions.  She was empirically started on aztreonam and vancomycin empirically. OB was consulted and advised treatment with magnesium infusion for pre-eclampsia and prn labetalol as needed for SBP goal < 140.  [facility].  Subsequent to transfer, she was treated with magnesium, foley placed and started on diuretic therapy, and placed on supplemental oxygen.  Was seen by OB/ GYN once in ICU.     She briefly required BiPAP overnight in the ICU and was continued on a magnesium drip for 24 hours maintaining goal SBP < 140 and requiring around 6L [place].  Was seen by lactation as well where she has been able to continue breast pumping, recommended every 2-3 hours.  On 12/27, CXR showed no significant improvement in bilateral infiltrates, therefore we increased her lasix dose and  sent initial autoimmune labs for concern of other pulmonary process, possibly vasculitis vs DAH.  On 12/28, noted to have rapid improvement on CXR, resolving lower extremity swelling, and remains asymptomatic with no SOB.  Echo performed showed normal EF 60-65 without other abnormality.  Antibiotics were discontinued given rapid CXR improvement, afebrile, reassuring PCT, and no symptoms suggestive of URI or pneumonia.  Thus far her RA, ESR, and CRP levels are mildly elevated, ANA and ANCA still pending.  Repeat UA has been negative and with rapid improvement on CXR, suggest against vasculitis process. She is hemodynamically stable and asymptomatic and ready for discharge home.     Discharge Plan by Active Problems    Post partum Pre-eclampsia Hypervolemia, resolved  Bilateral infiltrates, suspected pulmonary edema  Hypoxic respiratory failure, resolved  Hypertension P:  - increase labetalol 1021mBID -> 200 mg BID - she has home BP cuff to monitor blood pressure  - will send home on lasix 2063mID with KCL 20 meq daily for 7 days, to start 12/29; acknowledges to monitor milk supply  - will follow up with her [facility] agrees to daily weights to monitor fluid status- to call if greater than 2-3 lb weight gain per day, or headache, visual changes, difficulty breathing, fever, lower leg edema-> to seek medical evaluation - our pulmonary office will call to schedule follow-up appointment for repeat CXR in 6 weeks with Dr. EllLoanne Drilling ensure complete clearing of infiltrates and follow up completed autoimmune labs - she plans to continue pumping/ breast feeding at home  as well as supplemental formula feeding   Hx Depression - continue home [facility] tests/ studies   12/26 CTA PE >> 1. No evidence of pulmonary embolism. 2. Extensive patchy airspace consolidations throughout both lungs, most confluent within the right upper lobe. Findings  are most compatible with multifocal pneumonia including atypical or viral etiologies. 3. Small bilateral pleural effusions, right slightly greater than left.   12/27 TTE >>  EF 60-65%, normal RV, no valvular abnormalities   Procedures    Culture data/antimicrobials    12/26 SARS/ flu > neg 12/26 UC >> multi species; suggest recollect 12/26 Bcx2 > ngtd  12/26 vanc > 12/27 12/26 aztreonam > 12/27 12/26 flagyl   Consults  OB/ GYN Lactation     Discharge Exam: BP 133/90    Pulse 81    Temp 98 F (36.7 C)    Resp 16    Ht '5\' 4"'  (1.626 m)    Wt 85.7 kg    LMP 03/10/2021 Comment: just had a baby on 12./21/22   SpO2 96%    Breastfeeding Yes    BMI 32.43 kg/m   General:  young adult female sitting on side of bed in NAD HEENT: MM pink/moist Neuro: Aox3, MAE CV: rr, NSR, no murmur PULM:  non labored, CTA, on room air  GI: soft, bs+, NT Extremities: warm/dry, trace pedal edema  Skin: no rashes   Labs at discharge   Lab Results  Component Value Date   CREATININE 0.76 12/18/2021   BUN 14 12/18/2021   NA 136 12/18/2021   K 4.5 12/18/2021   CL 101 12/18/2021   CO2 23 12/18/2021   Lab Results  Component Value Date   WBC 10.5 12/18/2021   HGB 10.6 (L) 12/18/2021   HCT 32.5 (L) 12/18/2021   MCV 81.7 12/18/2021   PLT 462 (H) 12/18/2021   Lab Results  Component Value Date   ALT 16 12/17/2021   AST 16 12/17/2021   ALKPHOS 88 12/17/2021   BILITOT 0.8 12/17/2021   Lab Results  Component Value Date   INR 1.0 12/16/2021    Current radiological studies    DG Chest Port 1 View  Result Date: 12/18/2021 CLINICAL DATA:  Hypoxia, postpartum EXAM: PORTABLE CHEST 1 VIEW COMPARISON:  12/17/2021 FINDINGS: Lung aeration is improved with persistent patchy opacities bilaterally. No pleural effusion. Stable cardiomediastinal contours. No pneumothorax. IMPRESSION: Persistent but improved bilateral pulmonary opacities. Electronically Signed   By: Macy Mis M.D.   On: 12/18/2021  10:56   DG Chest Port 1 View  Result Date: 12/17/2021 CLINICAL DATA:  Pulmonary edema EXAM: PORTABLE CHEST 1 VIEW COMPARISON:  Yesterday FINDINGS: Confluent airspace disease which has a consolidative and nodular appearance by CT. Normal heart size. No visible effusion or pneumothorax IMPRESSION: Unchanged widespread airspace disease. Electronically Signed   By: Jorje Guild M.D.   On: 12/17/2021 08:28   ECHOCARDIOGRAM COMPLETE  Result Date: 12/17/2021    ECHOCARDIOGRAM REPORT   Patient Name:   NAVNEET SCHMUCK Date of Exam: 12/17/2021 Medical Rec #:  [id number]        Height:       64.0 in Accession #:    [id number]       Weight:       190.0 lb Date of Birth:  2001-08-06        BSA:          1.914 m Patient Age:    81 years  BP:           128/87 mmHg Patient Gender: F                HR:           100 bpm. Exam Location:  Inpatient Procedure: 2D Echo Indications:    acute respiratory distress  History:        Patient has no prior history of Echocardiogram examinations.                 Cardiomyopathy, pulmonary edema; Signs/Symptoms:eclampsia.  Sonographer:    Johny Chess RDCS Referring Phys: [place]  1. Left ventricular ejection fraction, by estimation, is 60 to 65%. The left ventricle has normal function. The left ventricle has no regional wall motion abnormalities. Left ventricular diastolic parameters were normal.  2. Right ventricular systolic function is normal. The right ventricular size is normal.  3. The mitral valve is abnormal. Trivial mitral valve regurgitation. No evidence of mitral stenosis.  4. The aortic valve is tricuspid. Aortic valve regurgitation is not visualized. No aortic stenosis is present.  5. The inferior vena cava is normal in size with greater than 50% respiratory variability, suggesting right atrial pressure of 3 mmHg. FINDINGS  Left Ventricle: Left ventricular ejection fraction, by estimation, is 60 to 65%. The left ventricle has normal  function. The left ventricle has no regional wall motion abnormalities. The left ventricular internal cavity size was normal in size. There is  no left ventricular hypertrophy. Left ventricular diastolic parameters were normal. Right Ventricle: The right ventricular size is normal. No increase in right ventricular wall thickness. Right ventricular systolic function is normal. Left Atrium: Left atrial size was normal in size. Right Atrium: Right atrial size was normal in size. Pericardium: There is no evidence of pericardial effusion. Mitral Valve: The mitral valve is abnormal. There is mild thickening of the mitral valve leaflet(s). Trivial mitral valve regurgitation. No evidence of mitral valve stenosis. Tricuspid Valve: The tricuspid valve is normal in structure. Tricuspid valve regurgitation is trivial. No evidence of tricuspid stenosis. Aortic Valve: The aortic valve is tricuspid. Aortic valve regurgitation is not visualized. No aortic stenosis is present. Pulmonic Valve: The pulmonic valve was normal in structure. Pulmonic valve regurgitation is trivial. No evidence of pulmonic stenosis. Aorta: The aortic root is normal in size and structure. Venous: The inferior vena cava is normal in size with greater than 50% respiratory variability, suggesting right atrial pressure of 3 mmHg. IAS/Shunts: No atrial level shunt detected by color flow Doppler.  LEFT VENTRICLE PLAX 2D LVIDd:         4.60 cm LVIDs:         3.00 cm LV PW:         1.00 cm LV IVS:        0.80 cm LVOT diam:     1.80 cm LV SV:         41 LV SV Index:   22 LVOT Area:     2.54 cm  RIGHT VENTRICLE             IVC RV S prime:     16.60 cm/s  IVC diam: 1.00 cm TAPSE (M-mode): 2.5 cm LEFT ATRIUM           Index        RIGHT ATRIUM           Index LA diam:      3.20 cm 1.67 cm/m  RA Area:     11.90 cm LA Vol (A2C): 46.2 ml 24.14 ml/m  RA Volume:   28.20 ml  14.73 ml/m LA Vol (A4C): 34.8 ml 18.18 ml/m  AORTIC VALVE LVOT Vmax:   98.10 cm/s LVOT Vmean:   64.900 cm/s LVOT VTI:    0.162 m  AORTA Ao Root diam: 2.60 cm Ao Asc diam:  2.50 cm  SHUNTS Systemic VTI:  0.16 m Systemic Diam: 1.80 cm Jenkins Rouge MD Electronically signed by Jenkins Rouge MD Signature Date/Time: 12/17/2021/10:16:46 AM    Final     Disposition:    Discharge disposition: 01-Home or Self Care       Discharge Instructions     Call MD for:  difficulty breathing, headache or visual disturbances   Complete by: As directed    Call MD for:  persistant dizziness or light-headedness   Complete by: As directed    Call MD for:  persistant nausea and vomiting   Complete by: As directed    Call MD for:  severe uncontrolled pain   Complete by: As directed    Call MD for:  temperature >100.4   Complete by: As directed    Diet general   Complete by: As directed        Allergies as of 12/18/2021       Reactions   Cefdinir Hives, Rash   Azithromycin Nausea Only, Rash   Penicillins Nausea Only, Rash   Gluten Meal    IBS        Medication List     STOP taking these medications    ibuprofen 800 MG tablet Commonly known as: ADVIL       TAKE these medications    cetirizine 10 MG tablet Commonly known as: ZYRTEC Take 10 mg by mouth daily.   FLUoxetine 10 MG capsule Commonly known as: PROZAC Take 1 capsule (10 mg total) by mouth daily.   furosemide 20 MG tablet Commonly known as: Lasix Take 1 tablet (20 mg total) by mouth daily for 7 days. Start taking on: December 19, 2021   labetalol 100 MG tablet Commonly known as: NORMODYNE Take 2 tablets (200 mg total) by mouth 2 (two) times daily. What changed: how much to take   potassium chloride SA 20 MEQ tablet Commonly known as: KLOR-CON M Take 1 tablet (20 mEq total) by mouth daily. Start taking on: December 19, 2021         Follow-up appointment    Primary OB/ GYN on 1/3 (at [facility])  [facility] will call to schedule f/u visit in 6 weeks    Discharge Condition:     stable   Time spent in discharge which was reviewed at bedside with patient: 40 mins      Kennieth Rad, ACNP [place] [facility] 12/18/2021, 6:29 PM  See Amion for pager If no response to pager, please call PCCM consult pager After 7:00 pm call Elink

## 2021-12-18 NOTE — Progress Notes (Signed)
NAME:  Christine Brewer, MRN:  [id number], DOB:  Mar 13, 2001, LOS: 2 ADMISSION DATE:  12/16/2021, CONSULTATION DATE:  12/26 REFERRING MD:  Dr. Iline Oven EDP, CHIEF COMPLAINT:   SOB  History of Present Illness:  20 year old female recently delivered on 12/20 at [redacted]w[redacted]d G1P1. Diagnosed with gestational hypertension during the 3rd trimester treated with labetalol. Admission for delivery was uncomplicated. Discharged 12/22. Since the time of discharge she complained of worsening shortness of breath, hemoptysis, and lower extremity edema. She has continued to take labetalol as prescribed. [facility] and was immediately found to have oxygen saturation 89% on room air. Imaging raised concern for pnemonia and she was given fluid bolus. OB was consulted and recommended magnesium bolus and infusion. Mag infusion not available at [facility] and [facility].   Pertinent  Medical History   has a past medical history of Depression and Pre-eclampsia.   Significant Hospital Events: Including procedures, antibiotic start and stop dates in addition to other pertinent events   12/26 admit, mag gtt, lasix x 1, briefly on BiPAP, tmax 101, vanc/ aztreonam  12/27 weaning [place] O2- 6L, ongoing mag gtt for 24hrs, tmax 100.5, additional lasix   Interim History / Subjective:  Afebrile  No URI symptoms Off mag after 24hrs (stopped 12/27 ~2200) Diuresed 8.8L On room air - denies SOB No headache since ER, no visual changes or further paresthesias   Objective   Blood pressure 136/88, pulse 84, temperature 97.9 F (36.6 C), resp. rate (!) 21, height '5\' 4"'  (1.626 m), weight 85.7 kg, last menstrual period 03/10/2021, SpO2 94 %, currently breastfeeding.        Intake/Output Summary (Last 24 hours) at 12/18/2021 0815 Last data filed at 12/18/2021 0700 Gross per 24 hour  Intake 2624.57 ml  Output 8450 ml  Net -5825.43  ml   Filed Weights   12/16/21 1159 12/18/21 0500  Weight: 86.2 kg 85.7 kg    Examination: General:  young adult female sitting upright in bed in NAD HEENT: MM pink/moist Neuro:  A/O x 4, MAE CV: NSR, no murmur PULM:  non labored, clear, diminished RLL GI: soft, +bs, NT, foley- cyu Extremities: warm/dry, resolving LE edema, trace pedal Skin: no rashes  - no clonus, brisk LE reflexes   UOP 8.8L/ 24 hrs  Previous unmeasured urinary occurrences- net I/O's inaccurate   Labs reviewed:  normal WBC, K 4.5, sCr 0.76, Mag 3.5 Afebrile MRSA PCR neg BC x2 ngtd  Urine legionella > pending Urine strep > neg  Autoimmune labs:  ESR 52 CRP 25.3  RA 15.8  ANA > pending ANCA > pending  Resolved Hospital Problem list     Assessment & Plan:   Post partum Pre-eclampsia Hypervolemia Bilateral infiltrates, suspected pulmonary edema  Hypoxic respiratory failure, resolved  - diuresed 8.8L with diuresis yesterday, last dose lasix 12/28 am - CXR today with rapid improvement, most suggestive of pulmonary edema.  Still has some residual bilateral opacities.  Elevated ESR, CRP and RA as above, ANA and ANCA pending.  Will have our pulmonary office call and arrange f/u in our office in 6 weeks for f/u and repeat imaging to ensure clearance - stopping abx- no infectious symptoms, PCT reassuring, cultures negative thus far - prn supplemental O2 for sat goal > 92% - Mag infusion for 24 hrs completed 12/27 pm - Greatly appreciate OB assistance  - SBP goal < 140/90: PRN labetalol -  Continue home labetalol 178m BID and prn  - Echo 12/27 reassuring/ normal, EF 60-65% - d/c foley today  - mobilize  - BMET in am  - stable for transfer out of ICU today.  OB will continue to follow.   Hypokalemia  - resolved, monitor BMET given lasix   [facility] - lactation continues to follow with recs to continue pumping 2-3 hours.  Patient not pumping this regularly at night  and is not saving breast milk.  Plans to bottle/ breast feed   Best Practice (right click and "Reselect all SmartList Selections" daily)   Diet/type: Regular consistency (see orders) DVT prophylaxis: LMWH GI prophylaxis: N/A Lines: N/A Foley:  Yes, and it is no longer needed and removal ordered  Code Status:  full code Last date of multidisciplinary goals of care discussion; patient updated on plan of care 12/28  [facility]  Women's bed not required per OB/ GYN.  PCCM will be available as needed after 12/29.  Labs   CBC: Recent Labs  Lab 12/16/21 1203 12/17/21 0600 12/18/21 0322  WBC 18.0* 14.1* 10.5  NEUTROABS 16.2* 11.0*  --   HGB 9.7* 9.5* 10.6*  HCT 30.4* 29.5* 32.5*  MCV 82.6 82.4 81.7  PLT 398 374 462*    Basic Metabolic Panel: Recent Labs  Lab 12/16/21 1203 12/16/21 2358 12/17/21 0600 12/17/21 1455 12/18/21 0322  NA 139 136 137  --  136  K 3.7 3.3* 3.8  --  4.5  CL 108 108 108  --  101  CO2 21* 19* 20*  --  23  GLUCOSE 83 139* 98  --  96  BUN '13 8 9  ' --  14  CREATININE 0.63 0.72 0.72  --  0.76  CALCIUM 8.3* 7.8* 7.1*  --  8.0*  MG  --  3.3* 5.0* 4.0* 3.5*  PHOS  --  3.7 4.5  --  5.5*   GFR: Estimated Creatinine Clearance: 119.8 mL/min (by C-G formula based on SCr of 0.76 mg/dL). Recent Labs  Lab 12/16/21 1203 12/16/21 2358 12/17/21 0600 12/18/21 0322  PROCALCITON  --  0.60 0.70  --   WBC 18.0*  --  14.1* 10.5  LATICACIDVEN 0.8 1.9  --   --     Liver Function Tests: Recent Labs  Lab 12/16/21 1203 12/16/21 2358 12/17/21 0600  AST '19 22 16  ' ALT '15 18 16  ' ALKPHOS 91 83 88  BILITOT 0.6 0.7 0.8  PROT 6.3* 5.6* 6.0*  ALBUMIN 3.5 2.5* 2.5*   No results for input(s): LIPASE, AMYLASE in the last 168 hours. No results for input(s): AMMONIA in the last 168 hours.  ABG No results found for: PHART, PCO2ART, PO2ART, HCO3, TCO2, ACIDBASEDEF, O2SAT   Coagulation Profile: Recent Labs  Lab 12/16/21 1203  INR 1.0     Cardiac Enzymes: No results for input(s): CKTOTAL, CKMB, CKMBINDEX, TROPONINI in the last 168 hours.  HbA1C: No results found for: HGBA1C  CBG: Recent Labs  Lab 12/16/21 1955  GLUCAP 82     Critical care time: n/a     BKennieth Rad ACNP [facility] 12/18/2021, 8:15 AM  See Amion for pager If no response to pager, please call PCCM consult pager After 7:00 pm call Elink

## 2021-12-18 NOTE — Progress Notes (Signed)
Patient discharged to home. Discharge instructions provided to patient and spouse by this RN. All patient questions answered and no further education needed at this time. Patient is going home via private vehicle driven by patient's father. VSS at time of d/c. RN transported patient to private vehicle via wheelchair.

## 2021-12-19 NOTE — Telephone Encounter (Signed)
Called and spoke with patient, advised that we needed to schedule a hospital f/u with Dr. Everardo All, she was able to schedule the f/u with me for 01/29/2022 at 2:30 pm, advised to arrive by 2:15 pm for check in.  I provided the address and phone # for the patient.  She verbalized understanding.  Nothing further needed.

## 2021-12-21 LAB — CULTURE, BLOOD (ROUTINE X 2)
Culture: NO GROWTH
Culture: NO GROWTH
Special Requests: ADEQUATE

## 2022-01-29 ENCOUNTER — Inpatient Hospital Stay: Payer: BC Managed Care – PPO | Admitting: Pulmonary Disease

## 2022-03-05 ENCOUNTER — Other Ambulatory Visit: Payer: Self-pay

## 2022-03-05 ENCOUNTER — Emergency Department (HOSPITAL_BASED_OUTPATIENT_CLINIC_OR_DEPARTMENT_OTHER)
Admission: EM | Admit: 2022-03-05 | Discharge: 2022-03-05 | Disposition: A | Discharge status: Home / Self Care | Payer: BC Managed Care – PPO | Attending: Emergency Medicine | Admitting: Emergency Medicine

## 2022-03-05 ENCOUNTER — Encounter (HOSPITAL_BASED_OUTPATIENT_CLINIC_OR_DEPARTMENT_OTHER): Payer: Self-pay | Admitting: Emergency Medicine

## 2022-03-05 DIAGNOSIS — S8011XA Contusion of right lower leg, initial encounter: Secondary | ICD-10-CM | POA: Diagnosis not present

## 2022-03-05 DIAGNOSIS — T07XXXA Unspecified multiple injuries, initial encounter: Secondary | ICD-10-CM

## 2022-03-05 DIAGNOSIS — S5012XA Contusion of left forearm, initial encounter: Secondary | ICD-10-CM | POA: Diagnosis not present

## 2022-03-05 DIAGNOSIS — B3731 Acute candidiasis of vulva and vagina: Secondary | ICD-10-CM | POA: Diagnosis not present

## 2022-03-05 DIAGNOSIS — S8991XA Unspecified injury of right lower leg, initial encounter: Secondary | ICD-10-CM | POA: Diagnosis present

## 2022-03-05 LAB — WET PREP, GENITAL
Clue Cells Wet Prep HPF POC: NONE SEEN
Sperm: NONE SEEN
Trich, Wet Prep: NONE SEEN
WBC, Wet Prep HPF POC: >=10 — AB

## 2022-03-05 LAB — HIV ANTIBODY (ROUTINE TESTING W REFLEX): HIV Screen 4th Generation wRfx: NONREACTIVE

## 2022-03-05 LAB — PREGNANCY, URINE: Preg Test, Ur: NEGATIVE

## 2022-03-05 MED ORDER — FLUCONAZOLE 150 MG PO TABS: 150.0000 mg | ORAL_TABLET | Freq: Every day | ORAL | 0 refills | Status: AC
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
## 2022-03-06 LAB — GC/CHLAMYDIA PROBE AMP (~~LOC~~) NOT AT ARMC
Chlamydia: NEGATIVE
Comment: NEGATIVE
Comment: NORMAL
Neisseria Gonorrhea: NEGATIVE

## 2022-03-06 LAB — RPR: RPR Ser Ql: NONREACTIVE

## 2022-05-02 IMAGING — CT CT ANGIO CHEST
2 of 7 series · 17 of 46 positions shown · IV contrast (omnipaque)
Comparison: Same day chest x-ray

CLINICAL DATA: Cough, shortness of breath, hemoptysis, leukocytosis

EXAM:
CT ANGIOGRAPHY CHEST WITH CONTRAST
TECHNIQUE: Multidetector CT imaging of the chest was performed using the
standard protocol during bolus administration of intravenous
contrast. Multiplanar CT image reconstructions and MIPs were
obtained to evaluate the vascular anatomy.
CONTRAST:  80mL OMNIPAQUE IOHEXOL 350 MG/ML SOLN

[Series 5: pe axial thins · axial · 0.73mm/px · z∈[-929,-673]mm · 14 of 296 slices shown]
[im 20/296  lung]
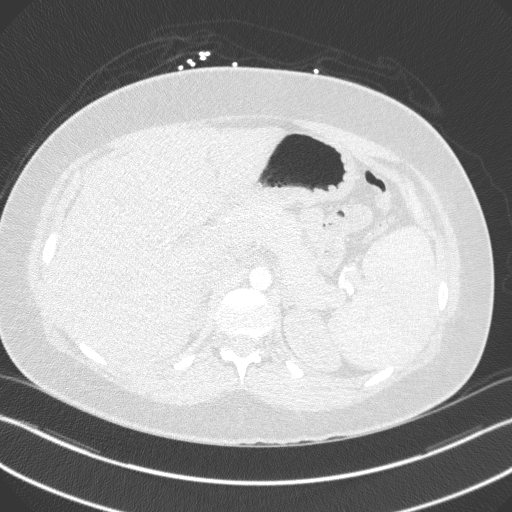
[im 40/296  soft-tissue]
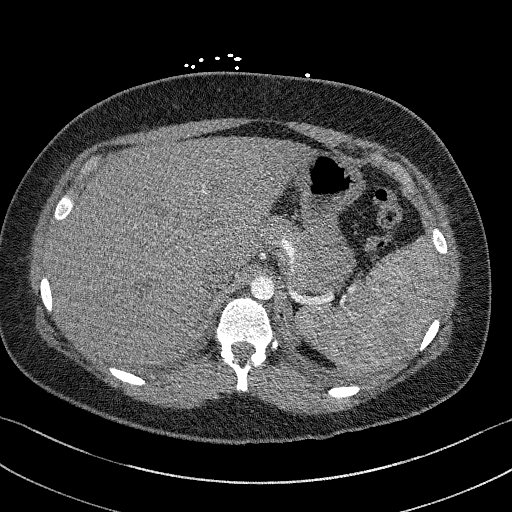
[im 60/296  lung]
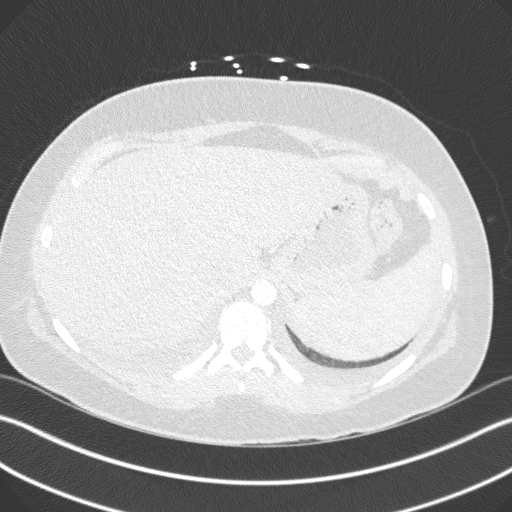
[im 79/296  soft-tissue]
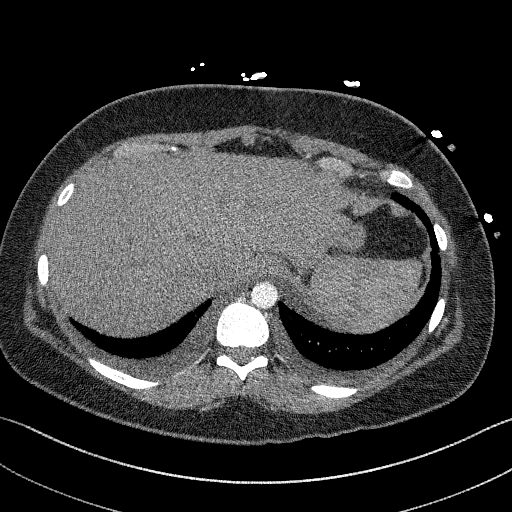
[im 99/296  lung]
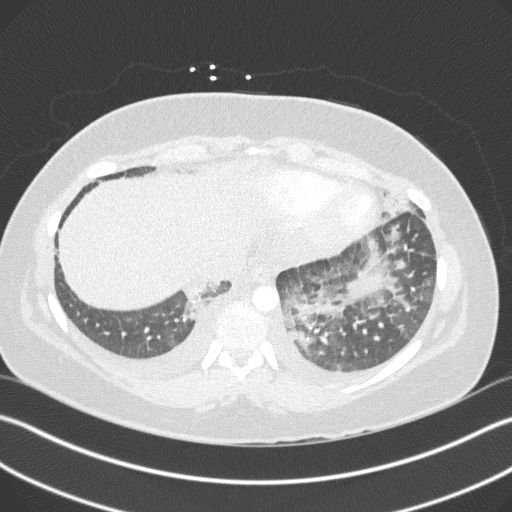
[im 119/296  soft-tissue]
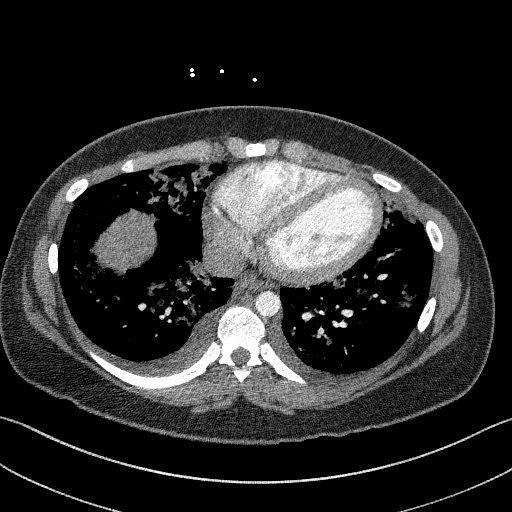
[im 138/296  lung]
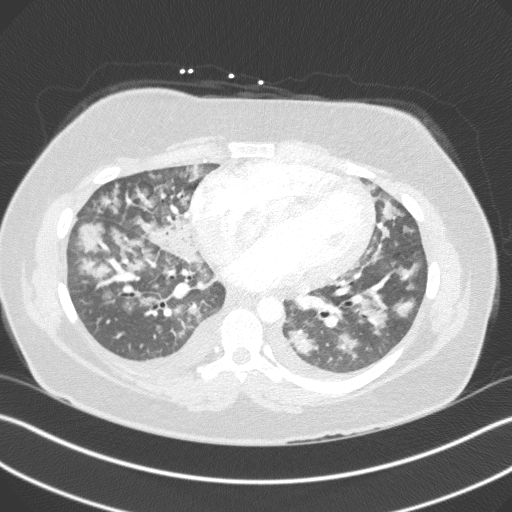
[im 158/296  soft-tissue]
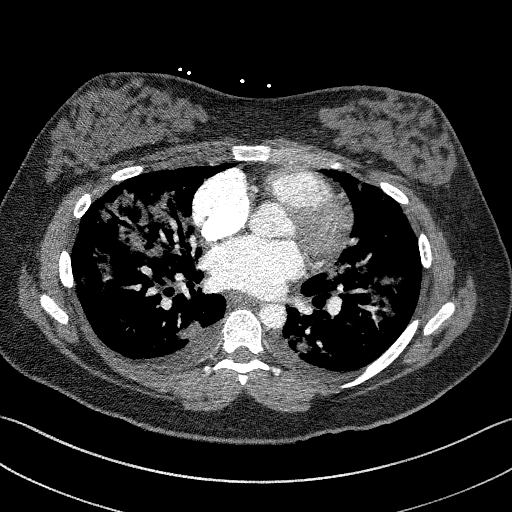
[im 178/296  lung]
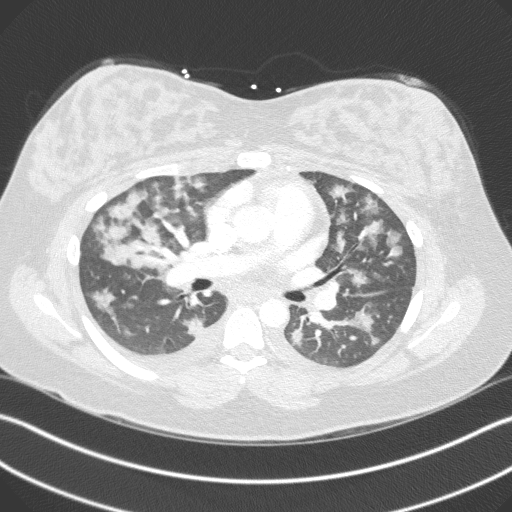
[im 197/296  soft-tissue]
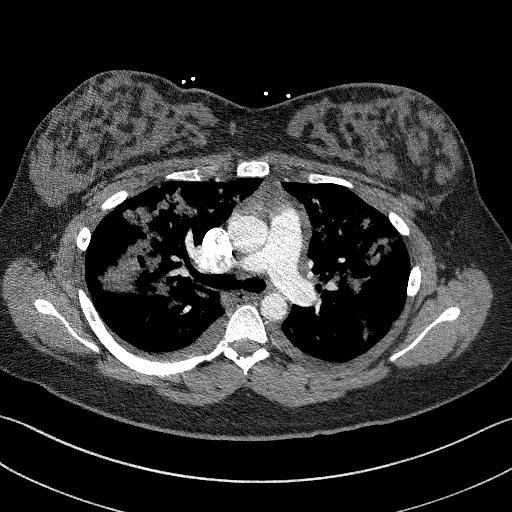
[im 217/296  lung]
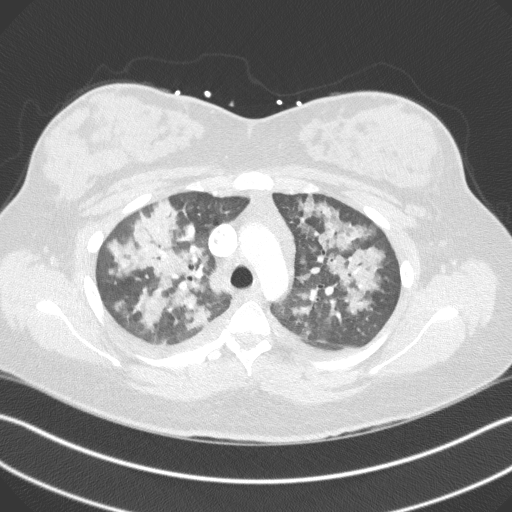
[im 237/296  soft-tissue]
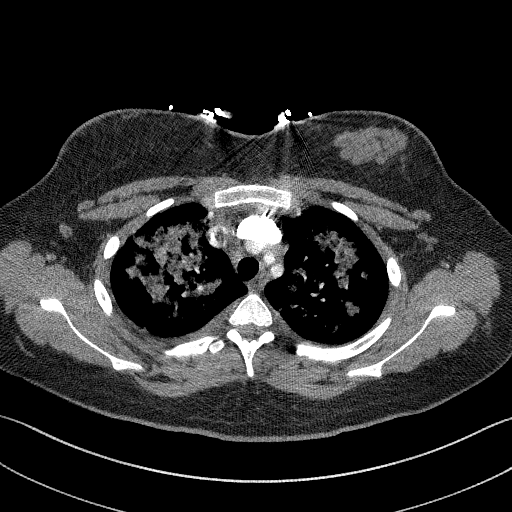
[im 256/296  lung]
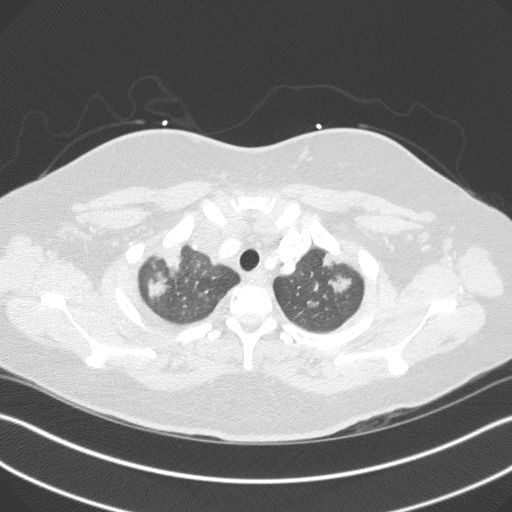
[im 276/296  soft-tissue]
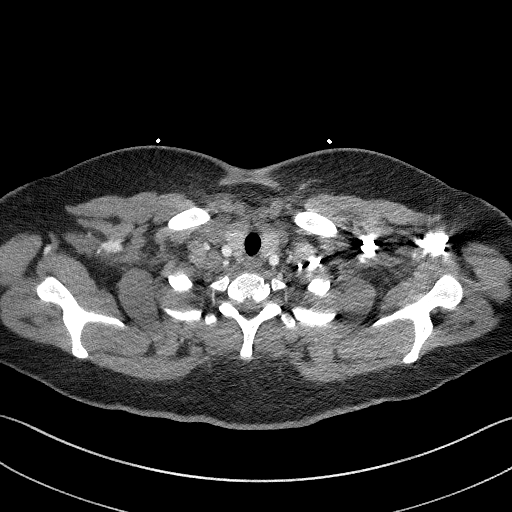

[Series 7: cor soft · coronal · 0.60mm/px · 3 of 133 slices shown]
[im 34/133  soft-tissue]
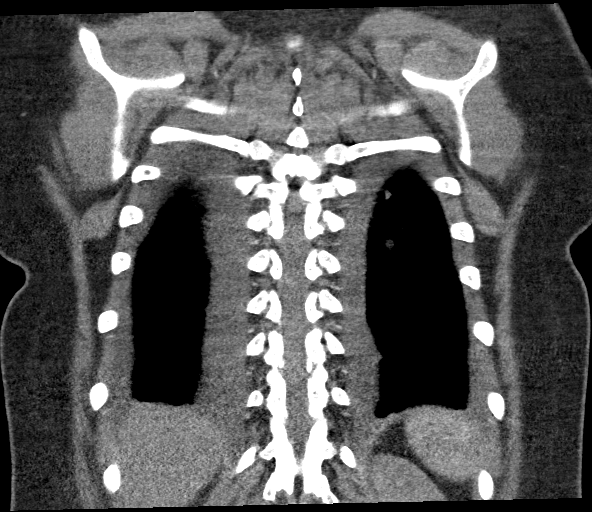
[im 67/133  soft-tissue]
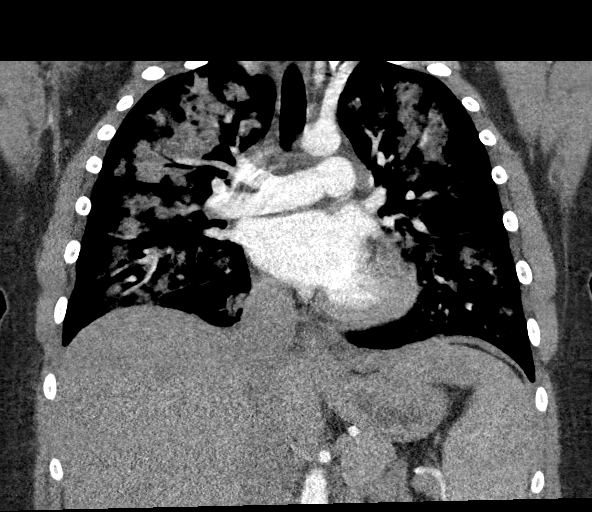
[im 100/133  soft-tissue]
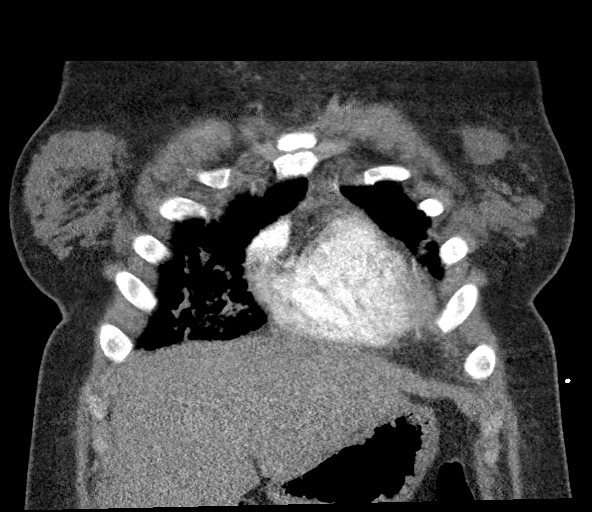

[17 of 46 positions shown; findings below may reference images not displayed]

FINDINGS: Cardiovascular: Satisfactory opacification of the pulmonary arteries
to the segmental level. No evidence of pulmonary embolism. Thoracic
aorta is normal in course and caliber. Normal heart size. No
pericardial effusion.

Mediastinum/Nodes: No enlarged mediastinal, hilar, or axillary lymph
nodes. Thyroid gland, trachea, and esophagus demonstrate no
significant findings.

Lungs/Pleura: Extensive patchy airspace consolidations throughout
both lungs with air bronchograms as well as surrounding ground-glass
opacity. Findings are most confluent within the right upper lobe.
Small bilateral pleural effusions, right slightly greater than left.
No pneumothorax.

Upper Abdomen: No acute abnormality.

Musculoskeletal: No chest wall abnormality. No acute or significant
osseous findings.

Review of the MIP images confirms the above findings.
IMPRESSION: 1. No evidence of pulmonary embolism.
2. Extensive patchy airspace consolidations throughout both lungs,
most confluent within the right upper lobe. Findings are most
compatible with multifocal pneumonia including atypical or viral
etiologies.
3. Small bilateral pleural effusions, right slightly greater than
left.

## 2022-05-02 IMAGING — DX DG CHEST 1V PORT
1 series · 1 of 1 positions shown · non-contrast
Comparison: Images of previous study done on 04/26/2020 are not
available for review. Report for the previous study is available for
review.

CLINICAL DATA: Fever, shortness of breath, hemoptysis

EXAM:
PORTABLE CHEST 1 VIEW

[chest]
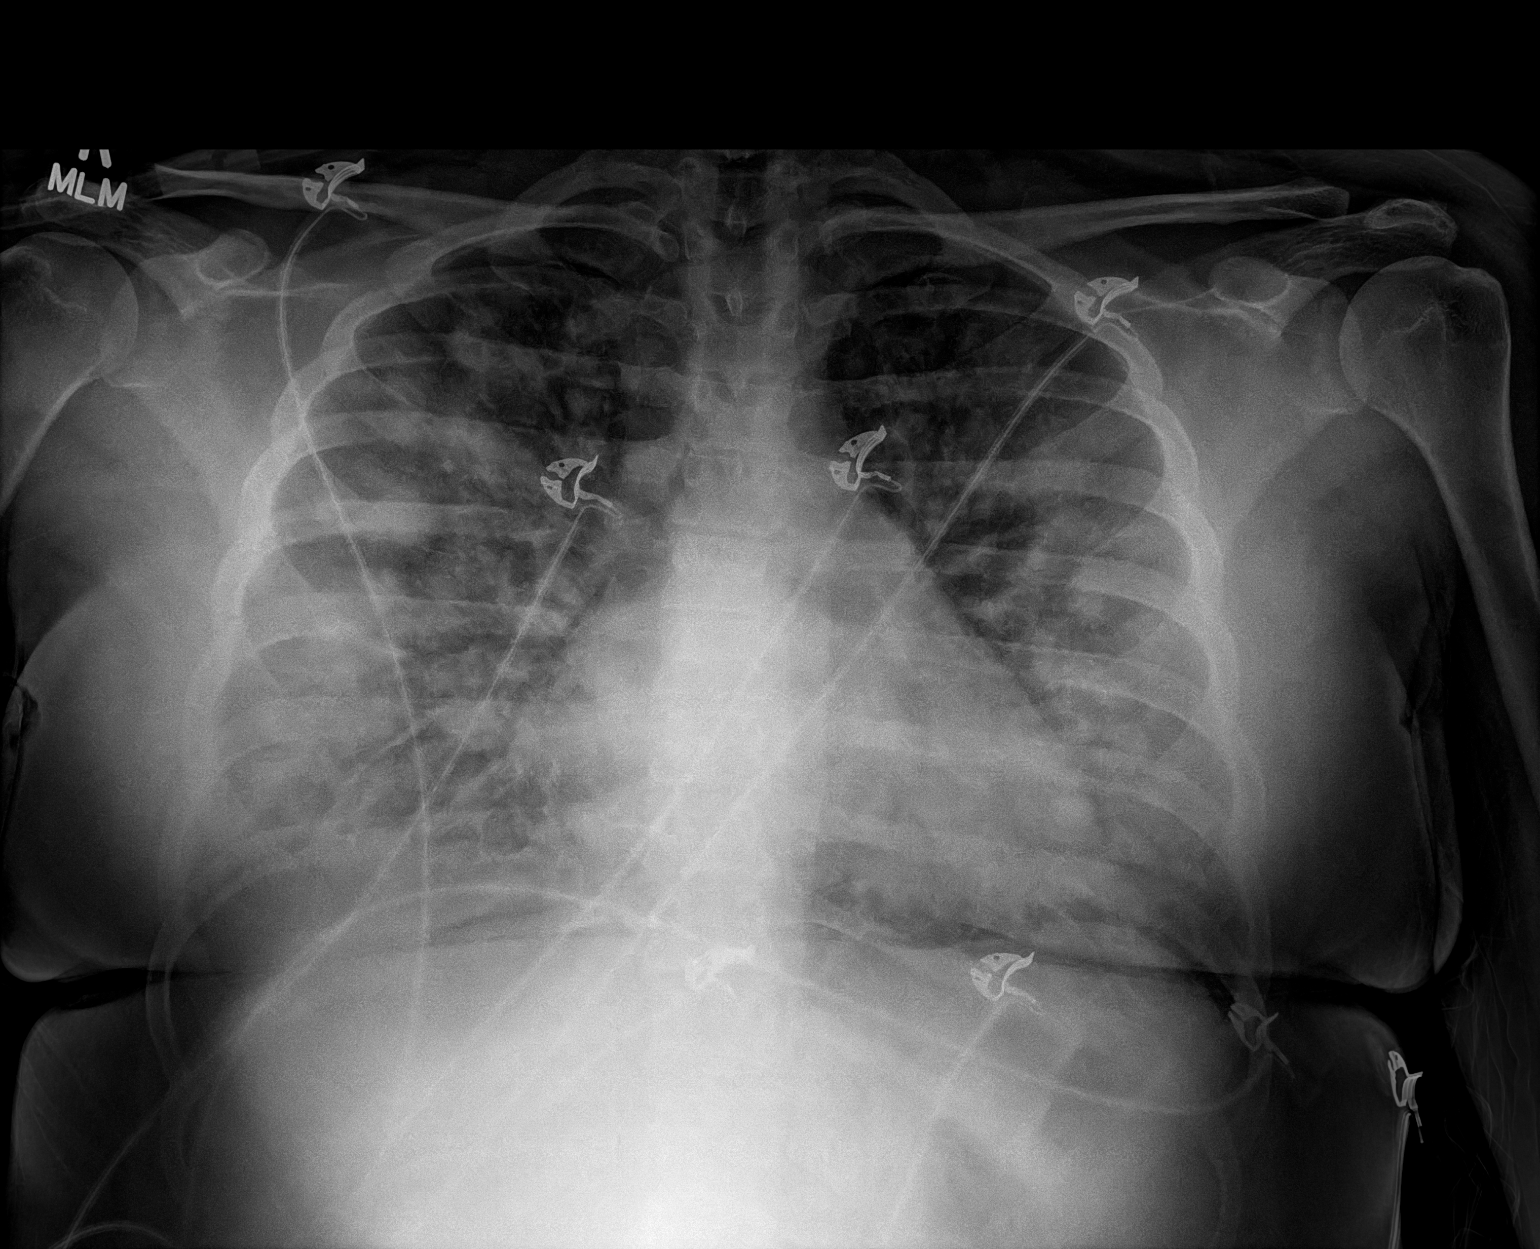

[1 of 1 positions shown; findings below may reference images not displayed]

FINDINGS: Cardiac size is within normal limits. Extensive patchy alveolar
densities seen in both lungs, more so on the right side. Right
lateral costophrenic angle is indistinct. There is no pneumothorax.
IMPRESSION: Extensive patchy alveolar infiltrates are seen in both lungs
suggesting extensive bilateral pneumonia. Possible small right
pleural effusion.

## 2022-05-03 IMAGING — DX DG CHEST 1V PORT
1 series · 1 of 1 positions shown · non-contrast
Comparison: Yesterday

CLINICAL DATA: Pulmonary edema

EXAM:
PORTABLE CHEST 1 VIEW

[chest ap]
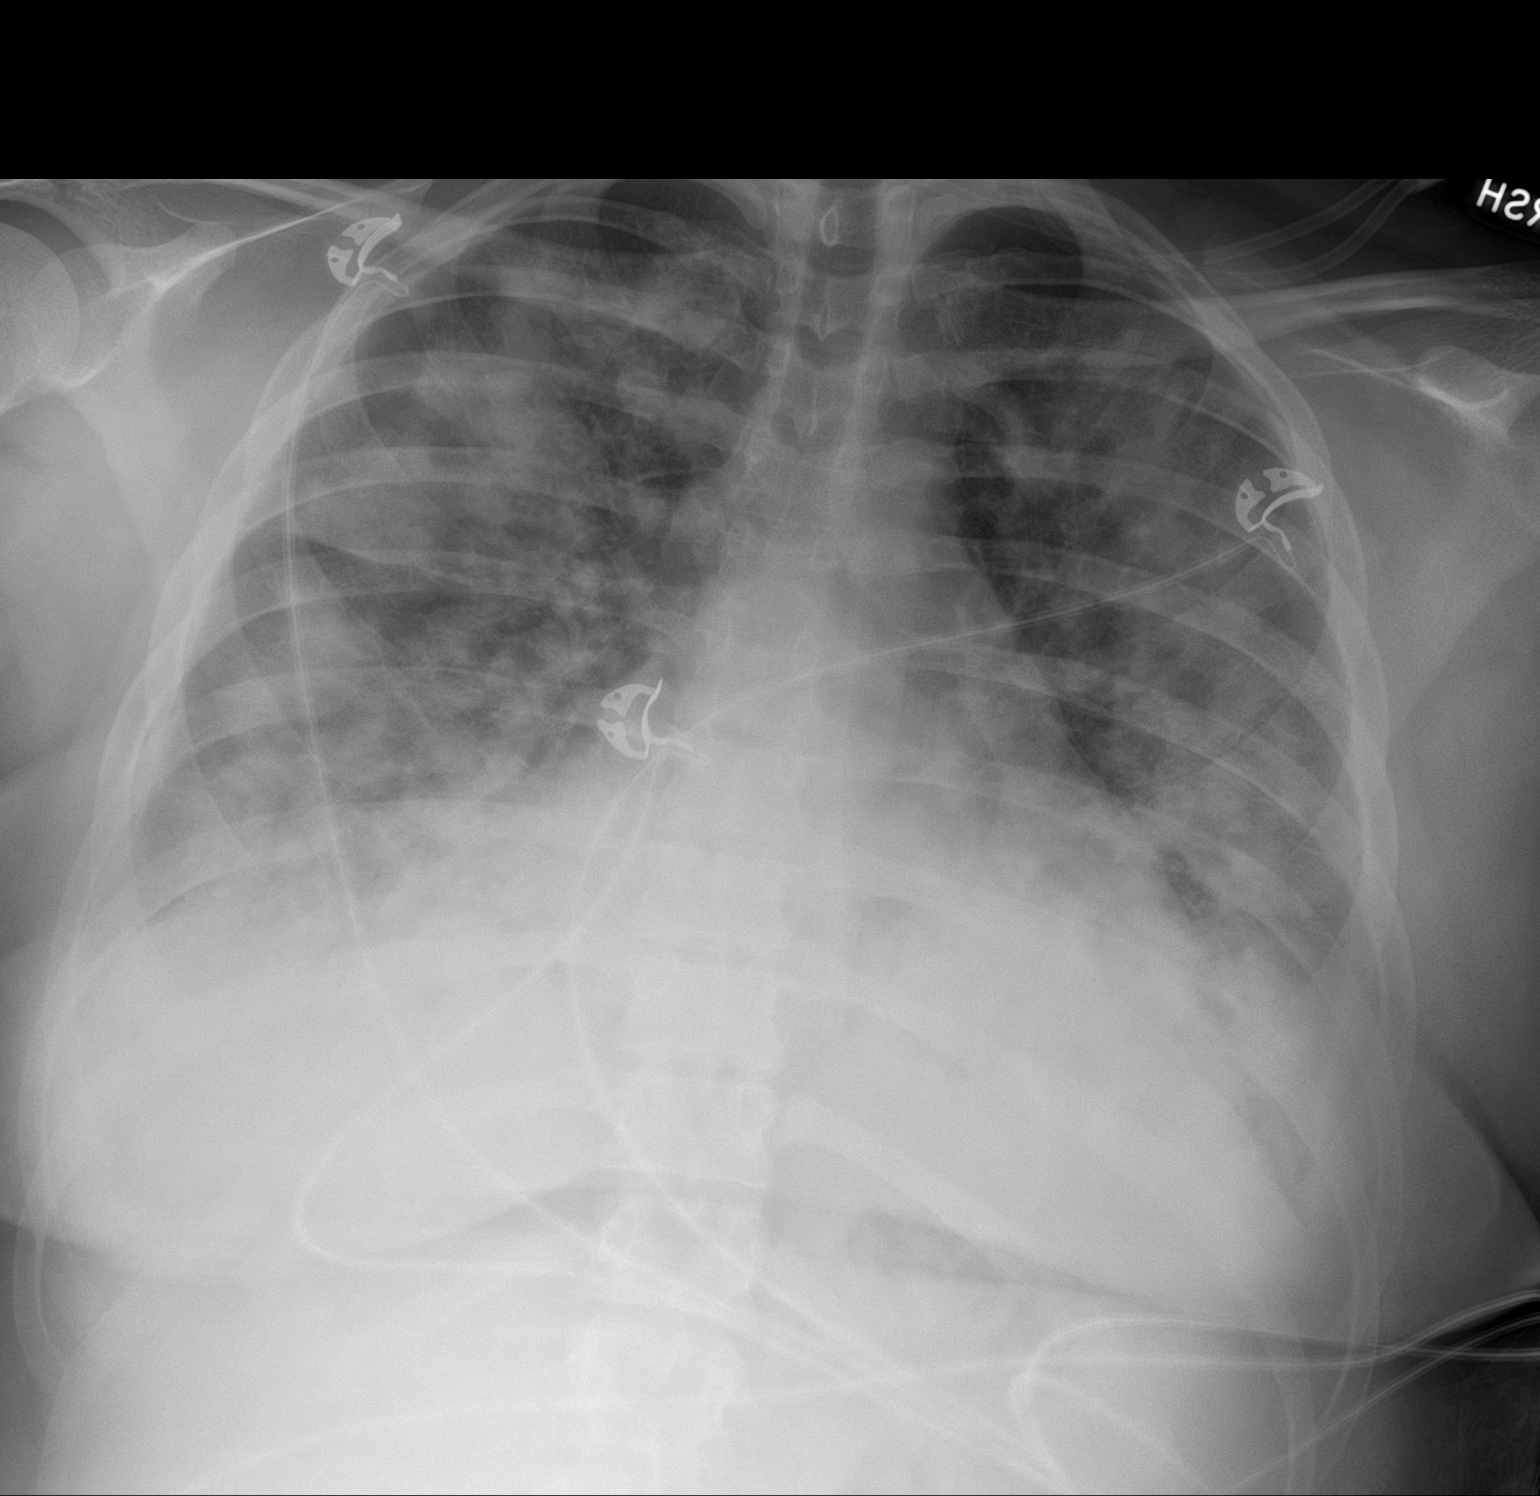

[1 of 1 positions shown; findings below may reference images not displayed]

FINDINGS: Confluent airspace disease which has a consolidative and nodular
appearance by CT. Normal heart size. No visible effusion or
pneumothorax
IMPRESSION: Unchanged widespread airspace disease.

## 2022-05-04 IMAGING — DX DG CHEST 1V PORT
1 series · 1 of 1 positions shown · non-contrast
Comparison: 12/17/2021

CLINICAL DATA: Hypoxia, postpartum

EXAM:
PORTABLE CHEST 1 VIEW

[chest ap]
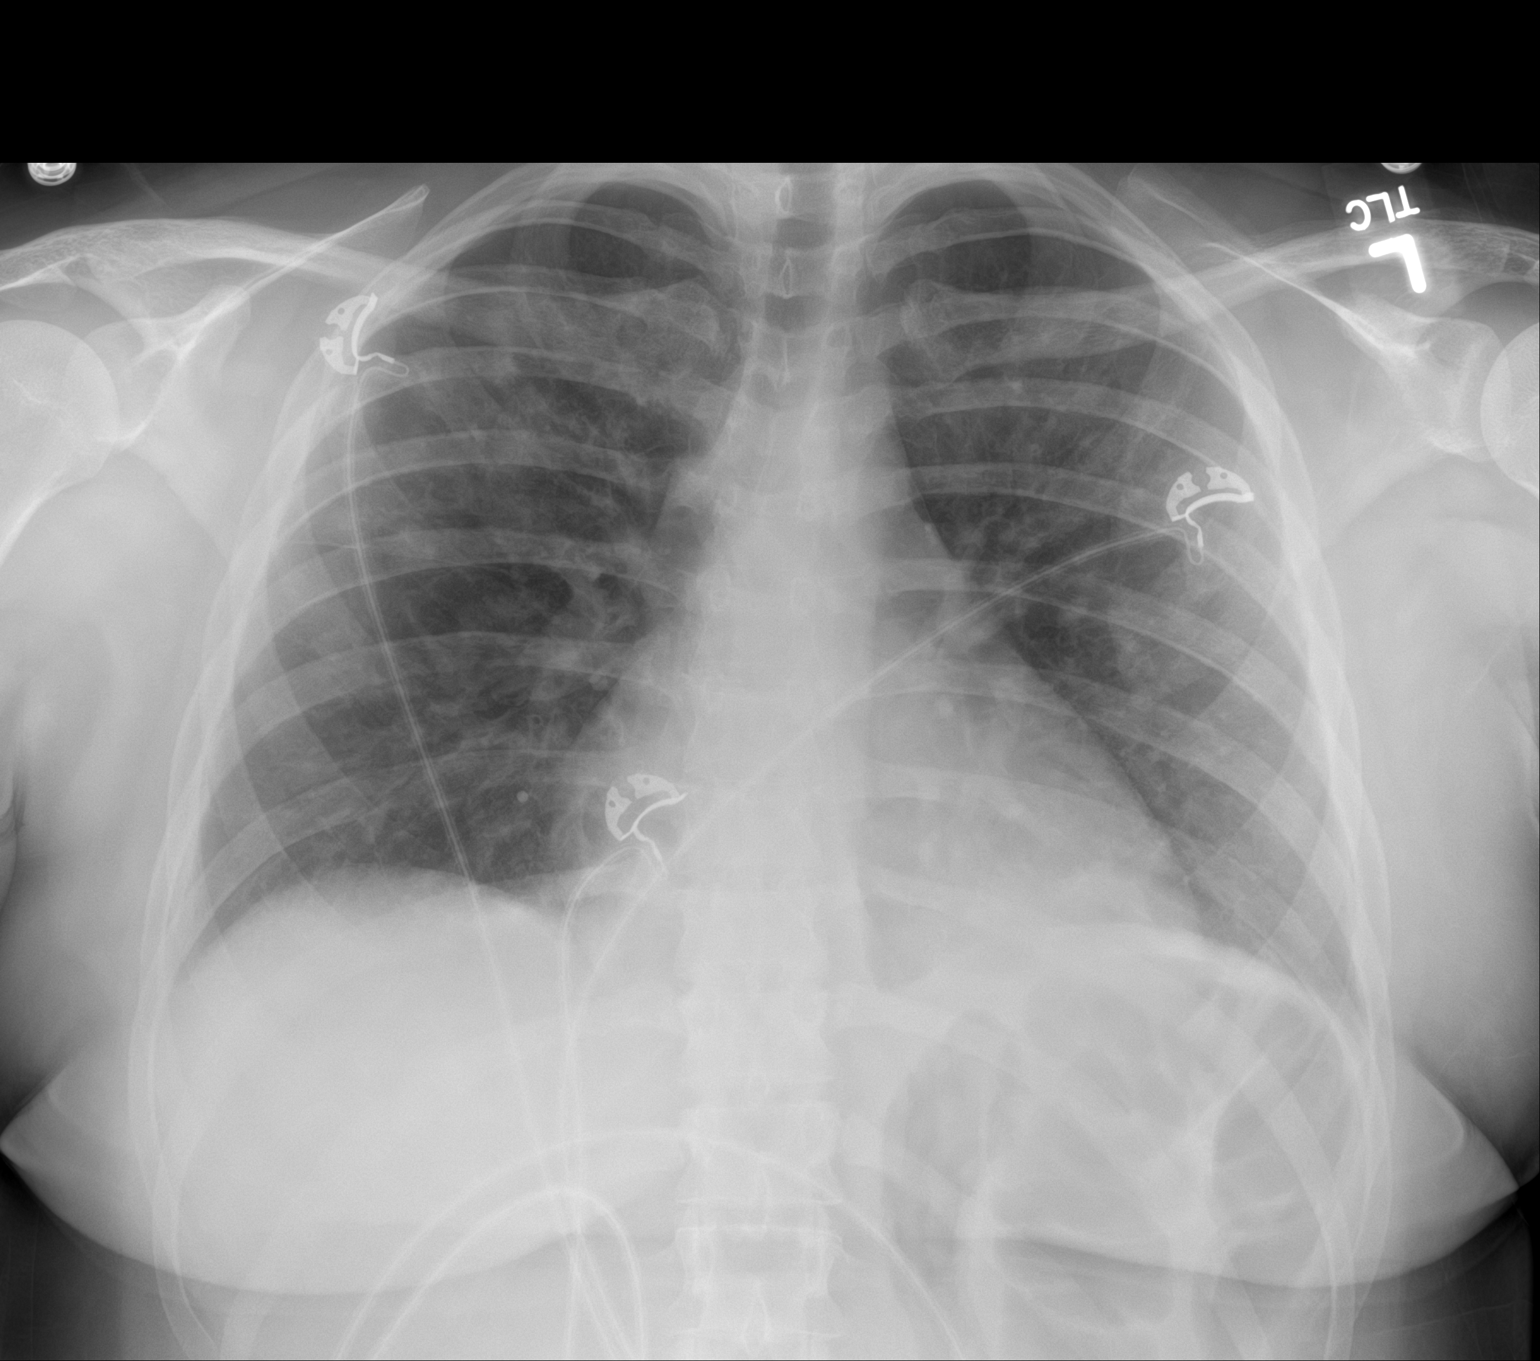

[1 of 1 positions shown; findings below may reference images not displayed]

FINDINGS: Lung aeration is improved with persistent patchy opacities
bilaterally. No pleural effusion. Stable cardiomediastinal contours.
No pneumothorax.
IMPRESSION: Persistent but improved bilateral pulmonary opacities.

## 2023-10-15 ENCOUNTER — Encounter (INDEPENDENT_AMBULATORY_CARE_PROVIDER_SITE_OTHER): Payer: Self-pay | Admitting: Family Medicine

## 2023-12-01 ENCOUNTER — Encounter (INDEPENDENT_AMBULATORY_CARE_PROVIDER_SITE_OTHER): Payer: BC Managed Care – PPO | Admitting: Adult Health

## 2023-12-04 ENCOUNTER — Emergency Department (HOSPITAL_BASED_OUTPATIENT_CLINIC_OR_DEPARTMENT_OTHER)
Admission: EM | Admit: 2023-12-04 | Discharge: 2023-12-05 | Disposition: A | Discharge status: Home / Self Care | Payer: BC Managed Care – PPO | Attending: Emergency Medicine | Admitting: Emergency Medicine

## 2023-12-04 ENCOUNTER — Encounter (HOSPITAL_BASED_OUTPATIENT_CLINIC_OR_DEPARTMENT_OTHER): Payer: Self-pay | Admitting: Emergency Medicine

## 2023-12-04 DIAGNOSIS — Z1152 Encounter for screening for COVID-19: Secondary | ICD-10-CM | POA: Insufficient documentation

## 2023-12-04 DIAGNOSIS — R112 Nausea with vomiting, unspecified: Secondary | ICD-10-CM | POA: Diagnosis present

## 2023-12-04 DIAGNOSIS — R10819 Abdominal tenderness, unspecified site: Secondary | ICD-10-CM | POA: Diagnosis not present

## 2023-12-04 DIAGNOSIS — R519 Headache, unspecified: Secondary | ICD-10-CM | POA: Insufficient documentation

## 2023-12-04 DIAGNOSIS — R197 Diarrhea, unspecified: Secondary | ICD-10-CM | POA: Diagnosis not present

## 2023-12-04 DIAGNOSIS — B349 Viral infection, unspecified: Secondary | ICD-10-CM | POA: Insufficient documentation

## 2023-12-04 LAB — CBC WITH DIFFERENTIAL/PLATELET
Abs Immature Granulocytes: 0.02 10*3/uL (ref 0.00–0.07)
Basophils Absolute: 0 10*3/uL (ref 0.0–0.1)
Basophils Relative: 0 %
Eosinophils Absolute: 0 10*3/uL (ref 0.0–0.5)
Eosinophils Relative: 1 %
HCT: 39.4 % (ref 36.0–46.0)
Hemoglobin: 13.6 g/dL (ref 12.0–15.0)
Immature Granulocytes: 0 %
Lymphocytes Relative: 26 %
Lymphs Abs: 1.4 10*3/uL (ref 0.7–4.0)
MCH: 29.8 pg (ref 26.0–34.0)
MCHC: 34.5 g/dL (ref 30.0–36.0)
MCV: 86.2 fL (ref 80.0–100.0)
Monocytes Absolute: 0.6 10*3/uL (ref 0.1–1.0)
Monocytes Relative: 11 %
Neutro Abs: 3.5 10*3/uL (ref 1.7–7.7)
Neutrophils Relative %: 62 %
Platelets: 234 10*3/uL (ref 150–400)
RBC: 4.57 MIL/uL (ref 3.87–5.11)
RDW: 12 % (ref 11.5–15.5)
WBC: 5.6 10*3/uL (ref 4.0–10.5)
nRBC: 0 % (ref 0.0–0.2)

## 2023-12-04 LAB — URINALYSIS, ROUTINE W REFLEX MICROSCOPIC
Bilirubin Urine: NEGATIVE
Glucose, UA: NEGATIVE mg/dL
Hgb urine dipstick: NEGATIVE
Ketones, ur: 40 mg/dL — AB
Nitrite: NEGATIVE
Specific Gravity, Urine: 1.031 — ABNORMAL HIGH (ref 1.005–1.030)
pH: 5.5 (ref 5.0–8.0)

## 2023-12-04 LAB — RESP PANEL BY RT-PCR (RSV, FLU A&B, COVID)  RVPGX2
Influenza A by PCR: NEGATIVE
Influenza B by PCR: NEGATIVE
Resp Syncytial Virus by PCR: NEGATIVE
SARS Coronavirus 2 by RT PCR: NEGATIVE

## 2023-12-04 LAB — PREGNANCY, URINE: Preg Test, Ur: NEGATIVE

## 2023-12-04 MED ORDER — DIPHENHYDRAMINE HCL 50 MG/ML IJ SOLN
25.0000 mg | Freq: Once | INTRAMUSCULAR | Status: AC
  Administered 2023-12-05: 25 mg via INTRAVENOUS
  Filled 2023-12-04: qty 1

## 2023-12-04 MED ORDER — KETOROLAC TROMETHAMINE 30 MG/ML IJ SOLN
15.0000 mg | Freq: Once | INTRAMUSCULAR | Status: AC
  Administered 2023-12-05: 15 mg via INTRAVENOUS
  Filled 2023-12-04: qty 1

## 2023-12-04 MED ORDER — LACTATED RINGERS IV BOLUS
1000.0000 mL | Freq: Once | INTRAVENOUS | Status: AC
  Administered 2023-12-04: 1000 mL via INTRAVENOUS

## 2023-12-04 MED ORDER — METOCLOPRAMIDE HCL 5 MG/ML IJ SOLN
10.0000 mg | Freq: Once | INTRAMUSCULAR | Status: AC
Start: 2023-12-05 — End: 2023-12-05
  Administered 2023-12-05: 10 mg via INTRAVENOUS
  Filled 2023-12-04: qty 2

## 2023-12-04 MED ORDER — ACETAMINOPHEN 325 MG PO TABS
650.0000 mg | ORAL_TABLET | Freq: Once | ORAL | Status: AC
  Administered 2023-12-04: 650 mg via ORAL
  Filled 2023-12-04: qty 2

## 2023-12-04 MED ORDER — ONDANSETRON HCL 4 MG/2ML IJ SOLN
4.0000 mg | Freq: Once | INTRAMUSCULAR | Status: AC
  Administered 2023-12-04: 4 mg via INTRAVENOUS
  Filled 2023-12-04: qty 2

## 2023-12-04 NOTE — ED Triage Notes (Signed)
Sick symptoms started on Tuesday. Rash, n/v/d fever chill Daughter/ daycare similar symptoms

## 2023-12-04 NOTE — ED Provider Notes (Signed)
[place] EMERGENCY DEPARTMENT AT [facility] Provider Note   CSN: [id number] Arrival date & time: 12/04/23  2054     History {Add pertinent medical, surgical, social history, OB history to HPI:1} Chief Complaint  Patient presents with   Headache    Christine Brewer is a 22 y.o. female.  Patient with a history of depression, previous preeclampsia here with a 3-day history of diarrhea, abdominal pain, vomiting, body aches, chills, headache.  She reports symptoms started this diarrhea up to 5-10 times daily with multiple episodes of vomiting.  Stools are nonbloody.  Emesis is nonbloody and nonbilious.  Has felt warm at home but not checked her temperature.  No significant sore throat, cough, shortness of breath or chest pain.  Does have a history of asthma.  Gradual onset headache started 2 days ago it is diffuse.  Associated with photophobia and phonophobia.  Denies thunderclap onset.  Denies focal weakness, numbness or tingling.  No pain with urination or blood in the urine.  She is also noticed a rash to her abdomen for the past 2 days. Her daughter was sick with a similar rash and fever but had no GI symptoms. No other sick contacts or recent travel.  The history is provided by the patient and the spouse.  Headache Associated symptoms: abdominal pain, diarrhea, fatigue, myalgias, nausea, vomiting and weakness   Associated symptoms: no congestion, no dizziness, no fever and no sore throat        Home Medications Prior to Admission medications   Medication Sig Start Date End Date Taking? Authorizing Provider  cetirizine (ZYRTEC) 10 MG tablet Take 10 mg by mouth daily.    [provider]  fluconazole (DIFLUCAN) 150 MG tablet Take 1 tablet (150 mg total) by mouth daily. 03/05/22   Elson Areas, PA-C  FLUoxetine (PROZAC) 10 MG capsule Take 1 capsule (10 mg total) by mouth daily. Patient not taking: Reported on 12/17/2021 02/26/21   Rankin, Shuvon B, NP  furosemide  (LASIX) 20 MG tablet Take 1 tablet (20 mg total) by mouth daily for 7 days. 12/19/21 12/26/21  Norton Blizzard, NP  labetalol (NORMODYNE) 100 MG tablet Take 2 tablets (200 mg total) by mouth 2 (two) times daily. 12/18/21 01/17/22  Norton Blizzard, NP  potassium chloride SA (KLOR-CON M) 20 MEQ tablet Take 1 tablet (20 mEq total) by mouth daily. 12/19/21   Norton Blizzard, NP      Allergies    Cefdinir, Azithromycin, Penicillins, and Gluten meal    Review of Systems   Review of Systems  Constitutional:  Positive for activity change, appetite change, chills and fatigue. Negative for fever.  HENT:  Negative for congestion, rhinorrhea and sore throat.   Respiratory:  Negative for chest tightness and shortness of breath.   Cardiovascular:  Negative for chest pain.  Gastrointestinal:  Positive for abdominal pain, diarrhea, nausea and vomiting.  Genitourinary:  Negative for dysuria and hematuria.  Musculoskeletal:  Positive for arthralgias and myalgias.  Neurological:  Positive for weakness and headaches. Negative for dizziness and light-headedness.    all other systems are negative except as noted in the HPI and PMH.   Physical Exam Updated Vital Signs BP (!) 136/92 (BP Location: Right Arm)   Pulse (!) 109   Temp 98.3 F (36.8 C) (Oral)   Resp 18   LMP 11/13/2023   SpO2 100%   Breastfeeding No  Physical Exam Vitals and nursing note reviewed.  Constitutional:  General: She is not in acute distress.    Appearance: She is well-developed. She is not ill-appearing.     Comments: tearful  HENT:     Head: Normocephalic and atraumatic.     Right Ear: Tympanic membrane normal.     Left Ear: Tympanic membrane normal.     Mouth/Throat:     Pharynx: No oropharyngeal exudate.     Comments: Clear oropharynx.  Uvula midline, no exudates or asymmetry Eyes:     Conjunctiva/sclera: Conjunctivae normal.     Pupils: Pupils are equal, round, and reactive to light.  Neck:     Comments: No  meningismus. Cardiovascular:     Rate and Rhythm: Normal rate and regular rhythm.     Heart sounds: Normal heart sounds. No murmur heard. Pulmonary:     Effort: Pulmonary effort is normal. No respiratory distress.     Breath sounds: Normal breath sounds.  Chest:     Chest wall: Tenderness present.  Abdominal:     Palpations: Abdomen is soft.     Tenderness: There is abdominal tenderness. There is no guarding or rebound.     Comments: Diffuse tenderness, no guarding or rebound  Musculoskeletal:        General: No tenderness. Normal range of motion.     Cervical back: Normal range of motion and neck supple. No rigidity.  Skin:    General: Skin is warm.     Findings: Rash present.     Comments: Petechial rash to abdomen No rash to palms or soles  Neurological:     Mental Status: She is alert and oriented to person, place, and time.     Cranial Nerves: No cranial nerve deficit.     Motor: No abnormal muscle tone.     Coordination: Coordination normal.     Comments:  5/5 strength throughout. CN 2-12 intact.Equal grip strength.   Psychiatric:        Behavior: Behavior normal.     ED Results / Procedures / Treatments   Labs (all labs ordered are listed, but only abnormal results are displayed) Labs Reviewed  RESP PANEL BY RT-PCR (RSV, FLU A&B, COVID)  [id number]  URINALYSIS, ROUTINE W REFLEX MICROSCOPIC  PREGNANCY, URINE  CBC WITH DIFFERENTIAL/PLATELET  COMPREHENSIVE METABOLIC PANEL  LIPASE, BLOOD    EKG None  Radiology No results found.  Procedures Procedures  {Document cardiac monitor, telemetry assessment procedure when appropriate:1}  Medications Ordered in ED Medications  lactated ringers bolus 1,000 mL (has no administration in time range)  ondansetron (ZOFRAN) injection 4 mg (has no administration in time range)  acetaminophen (TYLENOL) tablet 650 mg (has no administration in time range)    ED Course/ Medical Decision Making/ A&P   {   Click here for ABCD2,  HEART and other calculatorsREFRESH Note before signing :1}                              Medical Decision Making Amount and/or Complexity of Data Reviewed Independent Historian: spouse Labs: ordered. Decision-making details documented in ED Course. Radiology: ordered and independent interpretation performed. Decision-making details documented in ED Course. ECG/medicine tests: ordered and independent interpretation performed. Decision-making details documented in ED Course.  Risk OTC drugs. Prescription drug management.   4 days of vomiting, diarrhea, body aches, chills, headache.  Afebrile on arrival has not checked temperature at home.  Stable vitals.  Abdomen soft without peritoneal signs.  Suspect likely viral syndrome.  Low suspicion for subarachnoid hemorrhage, meningitis, temporal arteritis.  COVID and flu swabs in triage are negative.  Will hydrate, treat symptoms, check chest x-ray, urinalysis.  {Document critical care time when appropriate:1} {Document review of labs and clinical decision tools ie heart score, Chads2Vasc2 etc:1}  {Document your independent review of radiology images, and any outside records:1} {Document your discussion with family members, caretakers, and with consultants:1} {Document social determinants of health affecting pt's care:1} {Document your decision making why or why not admission, treatments were needed:1} Final Clinical Impression(s) / ED Diagnoses Final diagnoses:  None    Rx / DC Orders ED Discharge Orders     None

## 2023-12-05 ENCOUNTER — Emergency Department (HOSPITAL_BASED_OUTPATIENT_CLINIC_OR_DEPARTMENT_OTHER): Payer: BC Managed Care – PPO | Admitting: Radiology

## 2023-12-05 ENCOUNTER — Emergency Department (HOSPITAL_BASED_OUTPATIENT_CLINIC_OR_DEPARTMENT_OTHER): Payer: BC Managed Care – PPO

## 2023-12-05 LAB — COMPREHENSIVE METABOLIC PANEL
ALT: 25 U/L (ref 0–44)
AST: 25 U/L (ref 15–41)
Albumin: 4.8 g/dL (ref 3.5–5.0)
Alkaline Phosphatase: 49 U/L (ref 38–126)
Anion gap: 11 (ref 5–15)
BUN: 9 mg/dL (ref 6–20)
CO2: 26 mmol/L (ref 22–32)
Calcium: 9.9 mg/dL (ref 8.9–10.3)
Chloride: 103 mmol/L (ref 98–111)
Creatinine, Ser: 0.77 mg/dL (ref 0.44–1.00)
GFR, Estimated: >60 mL/min (ref >60)
Glucose, Bld: 84 mg/dL (ref 70–99)
Potassium: 3.8 mmol/L (ref 3.5–5.1)
Sodium: 140 mmol/L (ref 135–145)
Total Bilirubin: 0.7 mg/dL (ref <1.2)
Total Protein: 7.7 g/dL (ref 6.5–8.1)

## 2023-12-05 LAB — LIPASE, BLOOD: Lipase: 18 U/L (ref 11–51)

## 2023-12-05 MED ORDER — LACTATED RINGERS IV BOLUS
1000.0000 mL | Freq: Once | INTRAVENOUS | Status: AC
  Administered 2023-12-05: 1000 mL via INTRAVENOUS

## 2023-12-05 MED ORDER — ONDANSETRON 4 MG PO TBDP
4.0000 mg | ORAL_TABLET | Freq: Three times a day (TID) | ORAL | 0 refills | Status: AC | PRN
Start: 2023-12-05 — End: ?

## 2023-12-05 NOTE — Discharge Instructions (Signed)
Start with a clear liquid diet and advance slowly as tolerated.  Take the nausea medication as prescribed.  Use Tylenol or ibuprofen as needed for fever and aches.  Follow-up with your doctor.  As we discussed you likely have a viral syndrome.  COVID and flu testing today are negative but these are still possible as well as many other viruses.  Your urine is sent for culture today and you will be called if the results require antibiotic treatment.  Return to the ED sooner with worsening symptoms including intractable vomiting, abdominal pain, high fever, not able to eat or drink or other concerns.

## 2023-12-06 LAB — URINE CULTURE: Culture: NO GROWTH

## 2024-08-18 ENCOUNTER — Ambulatory Visit: Admitting: Neurology
